# Patient Record
Sex: Female | Born: 1975 | Race: Black or African American | Hispanic: No | Marital: Single | Smoking: Current every day smoker
Health system: Southern US, Community
[De-identification: ages and names within clinical notes are randomized; demographics above are authoritative.]

## PROBLEM LIST (undated history)

## (undated) ENCOUNTER — Emergency Department (HOSPITAL_COMMUNITY): Admission: EM | Payer: Medicaid Other | Source: Home / Self Care

## (undated) DIAGNOSIS — D259 Leiomyoma of uterus, unspecified: Secondary | ICD-10-CM

## (undated) DIAGNOSIS — R102 Pelvic and perineal pain: Secondary | ICD-10-CM

## (undated) DIAGNOSIS — R51 Headache: Secondary | ICD-10-CM

## (undated) DIAGNOSIS — F17200 Nicotine dependence, unspecified, uncomplicated: Secondary | ICD-10-CM

## (undated) DIAGNOSIS — Z5189 Encounter for other specified aftercare: Secondary | ICD-10-CM

## (undated) DIAGNOSIS — G8929 Other chronic pain: Secondary | ICD-10-CM

## (undated) DIAGNOSIS — D649 Anemia, unspecified: Secondary | ICD-10-CM

## (undated) DIAGNOSIS — R002 Palpitations: Secondary | ICD-10-CM

## (undated) DIAGNOSIS — I10 Essential (primary) hypertension: Secondary | ICD-10-CM

## (undated) HISTORY — PX: CYSTOSCOPY: SUR368

## (undated) HISTORY — DX: Palpitations: R00.2

## (undated) HISTORY — PX: GANGLION CYST EXCISION: SHX1691

---

## 1998-11-18 DIAGNOSIS — Z5189 Encounter for other specified aftercare: Secondary | ICD-10-CM

## 1998-11-18 DIAGNOSIS — IMO0001 Reserved for inherently not codable concepts without codable children: Secondary | ICD-10-CM

## 1998-11-18 HISTORY — DX: Reserved for inherently not codable concepts without codable children: IMO0001

## 1998-11-18 HISTORY — DX: Encounter for other specified aftercare: Z51.89

## 2009-05-13 ENCOUNTER — Emergency Department (HOSPITAL_COMMUNITY): Admission: EM | Admit: 2009-05-13 | Discharge: 2009-05-14 | Payer: Self-pay | Admitting: Emergency Medicine

## 2010-10-26 ENCOUNTER — Ambulatory Visit (HOSPITAL_COMMUNITY)
Admission: RE | Admit: 2010-10-26 | Discharge: 2010-10-26 | Payer: Self-pay | Source: Home / Self Care | Attending: Obstetrics and Gynecology | Admitting: Obstetrics and Gynecology

## 2010-10-26 HISTORY — PX: HYSTEROSCOPY WITH D & C: SHX1775

## 2011-01-29 LAB — CBC
HCT: 35.9 % — ABNORMAL LOW (ref 36.0–46.0)
RDW: 15.2 % (ref 11.5–15.5)
WBC: 9.6 10*3/uL (ref 4.0–10.5)

## 2011-02-25 LAB — POCT I-STAT, CHEM 8
BUN: 6 mg/dL (ref 6–23)
Chloride: 104 mEq/L (ref 96–112)
HCT: 35 % — ABNORMAL LOW (ref 36.0–46.0)
Sodium: 140 mEq/L (ref 135–145)
TCO2: 22 mmol/L (ref 0–100)

## 2011-02-25 LAB — WET PREP, GENITAL: WBC, Wet Prep HPF POC: NONE SEEN

## 2011-02-25 LAB — URINALYSIS, ROUTINE W REFLEX MICROSCOPIC
Glucose, UA: NEGATIVE mg/dL
Leukocytes, UA: NEGATIVE
Protein, ur: NEGATIVE mg/dL
Urobilinogen, UA: 1 mg/dL (ref 0.0–1.0)

## 2011-02-25 LAB — DIFFERENTIAL
Basophils Absolute: 0 10*3/uL (ref 0.0–0.1)
Lymphocytes Relative: 32 % (ref 12–46)
Neutro Abs: 6.2 10*3/uL (ref 1.7–7.7)
Neutrophils Relative %: 62 % (ref 43–77)

## 2011-02-25 LAB — POCT PREGNANCY, URINE: Preg Test, Ur: NEGATIVE

## 2011-02-25 LAB — CBC
Platelets: 456 10*3/uL — ABNORMAL HIGH (ref 150–400)
RDW: 16.6 % — ABNORMAL HIGH (ref 11.5–15.5)

## 2011-02-25 LAB — RPR: RPR Ser Ql: NONREACTIVE

## 2011-02-25 LAB — GC/CHLAMYDIA PROBE AMP, GENITAL
Chlamydia, DNA Probe: NEGATIVE
GC Probe Amp, Genital: NEGATIVE

## 2011-02-25 LAB — URINE MICROSCOPIC-ADD ON

## 2011-08-16 ENCOUNTER — Emergency Department (INDEPENDENT_AMBULATORY_CARE_PROVIDER_SITE_OTHER): Payer: BC Managed Care – PPO

## 2011-08-16 ENCOUNTER — Encounter: Payer: Self-pay | Admitting: *Deleted

## 2011-08-16 DIAGNOSIS — R0989 Other specified symptoms and signs involving the circulatory and respiratory systems: Secondary | ICD-10-CM

## 2011-08-16 DIAGNOSIS — J40 Bronchitis, not specified as acute or chronic: Secondary | ICD-10-CM | POA: Insufficient documentation

## 2011-08-16 DIAGNOSIS — R062 Wheezing: Secondary | ICD-10-CM

## 2011-08-16 DIAGNOSIS — R059 Cough, unspecified: Secondary | ICD-10-CM

## 2011-08-16 DIAGNOSIS — R0602 Shortness of breath: Secondary | ICD-10-CM | POA: Insufficient documentation

## 2011-08-16 DIAGNOSIS — R05 Cough: Secondary | ICD-10-CM

## 2011-08-16 NOTE — ED Notes (Signed)
C/o chest congestion and cough x 2 weeks

## 2011-08-17 ENCOUNTER — Emergency Department (HOSPITAL_BASED_OUTPATIENT_CLINIC_OR_DEPARTMENT_OTHER)
Admission: EM | Admit: 2011-08-17 | Discharge: 2011-08-17 | Disposition: A | Discharge status: Home / Self Care | Payer: BC Managed Care – PPO | Attending: Emergency Medicine | Admitting: Emergency Medicine

## 2011-08-17 DIAGNOSIS — J40 Bronchitis, not specified as acute or chronic: Secondary | ICD-10-CM

## 2011-08-17 MED ORDER — AZITHROMYCIN 250 MG PO TABS: 250.0000 mg | ORAL_TABLET | Freq: Every day | ORAL | Status: AC

## 2011-08-17 MED ORDER — AZITHROMYCIN 250 MG PO TABS
500.0000 mg | ORAL_TABLET | Freq: Once | ORAL | Status: AC
  Administered 2011-08-17: 500 mg via ORAL
  Filled 2011-08-17: qty 2

## 2011-08-17 NOTE — ED Notes (Signed)
Wants to know how long this will take,wants to be eating by 2am

## 2011-08-17 NOTE — ED Provider Notes (Signed)
History     CSN: [id number] Arrival date & time: 08/17/2011 12:35 AM  Chief Complaint  Patient presents with  . Cough    (Consider location/radiation/quality/duration/timing/severity/associated sxs/prior treatment) HPI Comments: No better for one week.  Patient is a 35 y.o. female presenting with cough. The history is provided by the patient.  Cough This is a new problem. The current episode started more than 1 week ago. The problem occurs constantly. The problem has been gradually worsening. The cough is non-productive. There has been no fever. Associated symptoms include shortness of breath and wheezing. Pertinent negatives include no chest pain and no chills. She has tried nothing for the symptoms. She is a smoker. Her past medical history does not include bronchitis or pneumonia.    History reviewed. No pertinent past medical history.  History reviewed. No pertinent past surgical history.  History reviewed. No pertinent family history.  History  Substance Use Topics  . Smoking status: Current Everyday Smoker  . Smokeless tobacco: Not on file  . Alcohol Use: No    OB History    Grav Para Term Preterm Abortions TAB SAB Ect Mult Living                  Review of Systems  Constitutional: Negative for chills.  Respiratory: Positive for cough, shortness of breath and wheezing.   Cardiovascular: Negative for chest pain.  All other systems reviewed and are negative.    Allergies  Review of patient's allergies indicates no known allergies.  Home Medications  No current outpatient prescriptions on file.  BP 132/78  Pulse 78  Temp(Src) 98.3 F (36.8 C) (Oral)  Resp 20  Ht 5\' 7"  (1.702 m)  Wt 223 lb (101.152 kg)  BMI 34.93 kg/m2  SpO2 100%  LMP 08/15/2011  Physical Exam  Constitutional: She is oriented to person, place, and time. She appears well-developed and well-nourished. No distress.  HENT:  Head: Normocephalic and atraumatic.  Right Ear: External ear  normal.  Left Ear: External ear normal.  Mouth/Throat: Oropharynx is clear and moist.  Neck: Normal range of motion. Neck supple.  Cardiovascular: Normal rate and regular rhythm.  Exam reveals no gallop and no friction rub.   No murmur heard. Pulmonary/Chest: Effort normal and breath sounds normal. No respiratory distress. She has no wheezes. She has no rales.  Abdominal: Soft.  Musculoskeletal: Normal range of motion.  Lymphadenopathy:    She has no cervical adenopathy.  Neurological: She is alert and oriented to person, place, and time.  Skin: Skin is warm and dry.    ED Course  Procedures (including critical care time)  Labs Reviewed - No data to display Dg Chest 2 View  08/17/2011  *RADIOLOGY REPORT*  Clinical Data: Cough, congestion, wheezing  CHEST - 2 VIEW  Comparison: None.  Findings: Lungs are clear. No pleural effusion or pneumothorax.  Cardiomediastinal silhouette is within normal limits.  Visualized osseous structures are within normal limits.  IMPRESSION: No evidence of acute cardiopulmonary disease.  Original Report Authenticated By: Charline Bills, M.D.     No diagnosis found.    MDM  No better in 2 weeks.  Will treat with antibiotics.        Geoffery Lyons, MD 08/17/11 [phone]

## 2011-09-04 ENCOUNTER — Encounter (HOSPITAL_BASED_OUTPATIENT_CLINIC_OR_DEPARTMENT_OTHER): Payer: Self-pay | Admitting: *Deleted

## 2011-09-04 ENCOUNTER — Emergency Department (INDEPENDENT_AMBULATORY_CARE_PROVIDER_SITE_OTHER): Payer: BC Managed Care – PPO

## 2011-09-04 ENCOUNTER — Emergency Department (HOSPITAL_BASED_OUTPATIENT_CLINIC_OR_DEPARTMENT_OTHER)
Admission: EM | Admit: 2011-09-04 | Discharge: 2011-09-04 | Disposition: A | Discharge status: Home / Self Care | Payer: BC Managed Care – PPO | Attending: Emergency Medicine | Admitting: Emergency Medicine

## 2011-09-04 DIAGNOSIS — N854 Malposition of uterus: Secondary | ICD-10-CM

## 2011-09-04 DIAGNOSIS — K5900 Constipation, unspecified: Secondary | ICD-10-CM | POA: Insufficient documentation

## 2011-09-04 DIAGNOSIS — N83209 Unspecified ovarian cyst, unspecified side: Secondary | ICD-10-CM

## 2011-09-04 DIAGNOSIS — R109 Unspecified abdominal pain: Secondary | ICD-10-CM | POA: Insufficient documentation

## 2011-09-04 DIAGNOSIS — F17200 Nicotine dependence, unspecified, uncomplicated: Secondary | ICD-10-CM | POA: Insufficient documentation

## 2011-09-04 DIAGNOSIS — D649 Anemia, unspecified: Secondary | ICD-10-CM | POA: Insufficient documentation

## 2011-09-04 HISTORY — DX: Leiomyoma of uterus, unspecified: D25.9

## 2011-09-04 LAB — CBC
HCT: 30.9 % — ABNORMAL LOW (ref 36.0–46.0)
Hemoglobin: 9.9 g/dL — ABNORMAL LOW (ref 12.0–15.0)
RDW: 14.9 % (ref 11.5–15.5)
WBC: 10.9 10*3/uL — ABNORMAL HIGH (ref 4.0–10.5)

## 2011-09-04 LAB — URINALYSIS, ROUTINE W REFLEX MICROSCOPIC
Leukocytes, UA: NEGATIVE
Specific Gravity, Urine: 1.028 (ref 1.005–1.030)
Urobilinogen, UA: 0.2 mg/dL (ref 0.0–1.0)

## 2011-09-04 LAB — URINE MICROSCOPIC-ADD ON

## 2011-09-04 LAB — PREGNANCY, URINE: Preg Test, Ur: NEGATIVE

## 2011-09-04 MED ORDER — MORPHINE SULFATE 4 MG/ML IJ SOLN
4.0000 mg | Freq: Once | INTRAMUSCULAR | Status: AC
  Administered 2011-09-04: 4 mg via INTRAVENOUS

## 2011-09-04 MED ORDER — ONDANSETRON HCL 4 MG/2ML IJ SOLN
INTRAMUSCULAR | Status: AC
  Filled 2011-09-04: qty 2

## 2011-09-04 MED ORDER — OXYCODONE-ACETAMINOPHEN 5-325 MG PO TABS: 2.0000 | ORAL_TABLET | ORAL | Status: AC | PRN

## 2011-09-04 MED ORDER — MORPHINE SULFATE 4 MG/ML IJ SOLN
INTRAMUSCULAR | Status: AC
  Filled 2011-09-04: qty 1

## 2011-09-04 MED ORDER — ONDANSETRON HCL 4 MG/2ML IJ SOLN
4.0000 mg | Freq: Once | INTRAMUSCULAR | Status: AC
  Administered 2011-09-04: 4 mg via INTRAVENOUS

## 2011-09-04 NOTE — ED Provider Notes (Signed)
History     CSN: [id number] Arrival date & time: 09/04/2011  5:41 PM   First MD Initiated Contact with Patient 09/04/11 1735      Chief Complaint  Patient presents with  . Constipation  . Abdominal Pain    (Consider location/radiation/quality/duration/timing/severity/associated sxs/prior treatment) HPI Comments: Pt states that she hasn't had a bm in 3 days:pt states that she also has a history of fibroids:pt states that today she is not sure which is causing her pain, but she states that she can't sit down because sitting makes the symptoms worse  Patient is a 35 y.o. female presenting with constipation. The history is provided by the patient. No language interpreter was used.  Constipation  The current episode started 3 to 5 days ago. The problem occurs continuously. The problem has been gradually worsening. The pain is severe. There was no prior successful therapy.    Past Medical History  Diagnosis Date  . Fibroid, uterus     History reviewed. No pertinent past surgical history.  History reviewed. No pertinent family history.  History  Substance Use Topics  . Smoking status: Current Everyday Smoker -- 0.5 packs/day  . Smokeless tobacco: Not on file  . Alcohol Use: No    OB History    Grav Para Term Preterm Abortions TAB SAB Ect Mult Living                  Review of Systems  Gastrointestinal: Positive for constipation.  All other systems reviewed and are negative.    Allergies  Review of patient's allergies indicates no known allergies.  Home Medications  No current outpatient prescriptions on file.  BP 126/77  Pulse 82  Temp(Src) 98.2 F (36.8 C) (Oral)  Resp 18  Ht 5' 7.5" (1.715 m)  Wt 221 lb 1 oz (100.273 kg)  BMI 34.11 kg/m2  SpO2 100%  LMP 08/15/2011  Physical Exam  Nursing note and vitals reviewed. Constitutional: She is oriented to person, place, and time. She appears well-developed and well-nourished.  HENT:  Head: Normocephalic and  atraumatic.  Eyes: Pupils are equal, round, and reactive to light.  Neck: Normal range of motion. Neck supple.  Cardiovascular: Normal rate and regular rhythm.   Pulmonary/Chest: Effort normal.  Abdominal: Soft. Bowel sounds are normal.  Genitourinary:       No impaction noted  Neurological: She is alert and oriented to person, place, and time.  Skin: Skin is warm and dry.  Psychiatric: She has a normal mood and affect.    ED Course  Procedures (including critical care time)  Labs Reviewed  CBC - Abnormal; Notable for the following:    WBC 10.9 (*)    Hemoglobin 9.9 (*)    HCT 30.9 (*)    MCV 77.4 (*)    MCH 24.8 (*)    All other components within normal limits  URINALYSIS, ROUTINE W REFLEX MICROSCOPIC - Abnormal; Notable for the following:    Hgb urine dipstick TRACE (*)    All other components within normal limits  URINE MICROSCOPIC-ADD ON - Abnormal; Notable for the following:    Squamous Epithelial / LPF FEW (*)    Bacteria, UA FEW (*)    All other components within normal limits  PREGNANCY, URINE   US Transvaginal Non-ob  09/04/2011  *RADIOLOGY REPORT*  Clinical Data: Abdominal pain.  History fibroids.  TRANSABDOMINAL AND TRANSVAGINAL ULTRASOUND OF PELVIS  Technique:  Both transabdominal and transvaginal ultrasound examinations of the pelvis were performed including  evaluation of the uterus, ovaries, adnexal regions, and pelvic cul-de-sac.  Comparison: None.  Findings:  Uterus: 7.0 x 4.9 x 4.8 cm.  Uterus is retroverted.  No focal abnormality.  Endometrium: Normal appearance and thickness, 11 mm.  Right Ovary:  The right ovary measures 4.6 x 3.6 x 3.4 cm.  There is a complex cystic area within the right ovary measuring 3.6 x 3.1 x 2.9 cm.  Internal septations noted.  There is a large amount of free fluid adjacent to the right ovary which appears partially loculated in the right ovary.  Left Ovary: 2.4 x 1.6 x 1.7 cm. Normal size and echotexture.  No adnexal masses.  Other  Findings:  No free fluid.  IMPRESSION: 3.6 cm complex cystic lesion within the right ovary.  There is also a large amount of adjacent free fluid adjacent to the right ovary. Question ruptured cyst.  A complex cyst warrants close follow-up with repeat ultrasound in 6-8 weeks.  While this could reflect a hemorrhagic cyst, cystic ovarian neoplasm cannot be excluded.  Original Report Authenticated By: Cyndie Chime, M.D.   US Pelvis Complete  09/04/2011  *RADIOLOGY REPORT*  Clinical Data: Abdominal pain.  History fibroids.  TRANSABDOMINAL AND TRANSVAGINAL ULTRASOUND OF PELVIS  Technique:  Both transabdominal and transvaginal ultrasound examinations of the pelvis were performed including evaluation of the uterus, ovaries, adnexal regions, and pelvic cul-de-sac.  Comparison: None.  Findings:  Uterus: 7.0 x 4.9 x 4.8 cm.  Uterus is retroverted.  No focal abnormality.  Endometrium: Normal appearance and thickness, 11 mm.  Right Ovary:  The right ovary measures 4.6 x 3.6 x 3.4 cm.  There is a complex cystic area within the right ovary measuring 3.6 x 3.1 x 2.9 cm.  Internal septations noted.  There is a large amount of free fluid adjacent to the right ovary which appears partially loculated in the right ovary.  Left Ovary: 2.4 x 1.6 x 1.7 cm. Normal size and echotexture.  No adnexal masses.  Other Findings:  No free fluid.  IMPRESSION: 3.6 cm complex cystic lesion within the right ovary.  There is also a large amount of adjacent free fluid adjacent to the right ovary. Question ruptured cyst.  A complex cyst warrants close follow-up with repeat ultrasound in 6-8 weeks.  While this could reflect a hemorrhagic cyst, cystic ovarian neoplasm cannot be excluded.  Original Report Authenticated By: Cyndie Chime, M.D.     1. Ovarian cyst   2. Anemia   3. Constipation       MDM  Pt has a history of anemia and pt states that it has been worse then this:pt states that she is not symptomatic at this time:discussed  right cyst with pt and discussed and pt states that she has a history of fibroids, but not of cyst:pt is to follow [id number] use of miralax with the pt        Teressa Lower, NP 09/04/11 2005

## 2011-09-04 NOTE — ED Notes (Signed)
Pt is standing in room and states she more comfortable standing

## 2011-09-04 NOTE — ED Notes (Signed)
While giving pt instructions for percocet. Pt stated that 5mg  of oxycodone doesn't work and requested stronger oxycodone. Informed pt prescription was written for 2 percocets at a time. PA made aware of pt request and no new prescription was written.

## 2011-09-04 NOTE — ED Notes (Signed)
Pt c/o no BM x 3 days, lower abd pain with hx of fibroids

## 2011-09-04 NOTE — ED Notes (Signed)
Pt ambulatory to ultrasound.

## 2011-09-06 NOTE — ED Provider Notes (Signed)
Evaluation and management procedures were performed by the PA/NP under my supervision/collaboration.    Edem Tiegs D Johnie Makki, MD 09/06/11 1929 

## 2011-09-30 ENCOUNTER — Encounter (HOSPITAL_BASED_OUTPATIENT_CLINIC_OR_DEPARTMENT_OTHER): Payer: Self-pay | Admitting: *Deleted

## 2011-09-30 NOTE — H&P (Signed)
Felipa Eth  DICTATION # [id number] CSN# [id number]   Meriel Pica, MD 09/30/2011 9:32 AM

## 2011-09-30 NOTE — Progress Notes (Signed)
NPO after MN. Pt to arrive at 0615. Needs hg and urine preg.

## 2011-09-30 NOTE — H&P (Signed)
NAMELUCAS, EXLINE NO.:  [id number]  MEDICAL RECORD NO.:  [id number]  LOCATION:                               FACILITY:  [facility]  PHYSICIAN:  Duke Salvia. Marcelle Overlie, M.D.DATE OF BIRTH:  September 28, 1976  DATE OF ADMISSION:  10/01/2011 DATE OF DISCHARGE:                             HISTORY & PHYSICAL   DATE OF SCHEDULED SURGERY:  October 01, 2011.  CHIEF COMPLAINT:  Chronic pelvic pain.  HISTORY OF PRESENT ILLNESS:  This is a 35 year old, G0, P0, was seen recently after evaluation at the [facility] ED for acute and chronic abdominal pelvic pain.  They performed an ultrasound in [facility] that showed no evidence of fibroids, but she did have a right-sided 3.6 x 3.1 x 2.9 cm complex ovarian mass with some free fluid consistent with a ruptured ovarian cyst.  She continued to have problems with pelvic pain that she states have been more of an issue to her over the last 6 months including deep dyspareunia and now is requiring narcotics for relief. We discussed several options.  She prefers diagnostic laparoscopy, which she presents for now.  This procedure including risks related to bleeding, infection, adjacent organ injury, the possible need for open additional surgery, all discussed with her, which she understands and accepts.  PAST MEDICAL HISTORY:  Allergies, none.  CURRENT MEDICATIONS:  Oxycodone p.r.n.  PRIOR SURGERY:  She had D and C, hysteroscopy for an endometrial polyp that was October 26, 2010, and also removal of a ganglion cyst in her right wrist.  REVIEW OF SYSTEMS:  Significant for anemia.  FAMILY HISTORY:  Otherwise negative.  SOCIAL HISTORY:  Smokes 1 PPD.  Denies alcohol or drug use.  She is single.  PHYSICAL EXAMINATION:  VITAL SIGNS:  Temperature 98.2, blood pressure 130/82. HEENT:  Unremarkable. NECK:  Supple without masses. LUNGS:  Clear. CARDIOVASCULAR:  Regular rate and rhythm without murmurs, rubs, gallops. BREASTS:  Without  masses. ABDOMEN:  Soft, flat, nontender. PELVIC:  Normal external genitalia.  Vagina and cervix clear.  Uterus mid position, normal size.  Adnexa, negative. EXTREMITIES:  Unremarkable. NEUROLOGIC:  Unremarkable.  IMPRESSION:  Chronic pelvic pain.  PLAN:  Diagnostic laparoscopy.  Procedure and risks reviewed as above.     Jazzalynn Rhudy M. Marcelle Overlie, M.D.     RM[facility]/MEDQ  D:  09/30/2011  T:  09/30/2011  Job:  [id number]

## 2011-10-01 ENCOUNTER — Ambulatory Visit (HOSPITAL_BASED_OUTPATIENT_CLINIC_OR_DEPARTMENT_OTHER)
Admission: RE | Admit: 2011-10-01 | Discharge: 2011-10-01 | Disposition: A | Discharge status: Home / Self Care | Payer: BC Managed Care – PPO | Source: Ambulatory Visit | Attending: Obstetrics and Gynecology | Admitting: Obstetrics and Gynecology

## 2011-10-01 ENCOUNTER — Encounter (HOSPITAL_BASED_OUTPATIENT_CLINIC_OR_DEPARTMENT_OTHER)
Admission: RE | Disposition: A | Discharge status: Home / Self Care | Payer: Self-pay | Source: Ambulatory Visit | Attending: Obstetrics and Gynecology

## 2011-10-01 ENCOUNTER — Encounter (HOSPITAL_BASED_OUTPATIENT_CLINIC_OR_DEPARTMENT_OTHER): Payer: Self-pay | Admitting: Anesthesiology

## 2011-10-01 ENCOUNTER — Ambulatory Visit (HOSPITAL_BASED_OUTPATIENT_CLINIC_OR_DEPARTMENT_OTHER): Payer: BC Managed Care – PPO | Admitting: Anesthesiology

## 2011-10-01 ENCOUNTER — Encounter (HOSPITAL_COMMUNITY): Payer: Self-pay | Admitting: *Deleted

## 2011-10-01 ENCOUNTER — Inpatient Hospital Stay (HOSPITAL_COMMUNITY)
Admission: AD | Admit: 2011-10-01 | Discharge: 2011-10-02 | Disposition: A | Discharge status: Home / Self Care | Payer: BC Managed Care – PPO | Source: Ambulatory Visit | Attending: Obstetrics and Gynecology | Admitting: Obstetrics and Gynecology

## 2011-10-01 DIAGNOSIS — R109 Unspecified abdominal pain: Secondary | ICD-10-CM

## 2011-10-01 DIAGNOSIS — N736 Female pelvic peritoneal adhesions (postinfective): Secondary | ICD-10-CM | POA: Insufficient documentation

## 2011-10-01 DIAGNOSIS — N949 Unspecified condition associated with female genital organs and menstrual cycle: Secondary | ICD-10-CM | POA: Insufficient documentation

## 2011-10-01 DIAGNOSIS — N731 Chronic parametritis and pelvic cellulitis: Secondary | ICD-10-CM | POA: Insufficient documentation

## 2011-10-01 HISTORY — PX: LAPAROSCOPY: SHX197

## 2011-10-01 HISTORY — DX: Pelvic and perineal pain: R10.2

## 2011-10-01 HISTORY — DX: Other chronic pain: G89.29

## 2011-10-01 SURGERY — Surgical Case
Anesthesia: *Unknown

## 2011-10-01 SURGERY — LAPAROSCOPY, DIAGNOSTIC
Anesthesia: General | Site: Abdomen | Wound class: Clean Contaminated

## 2011-10-01 MED ORDER — ONDANSETRON HCL 4 MG/2ML IJ SOLN
INTRAMUSCULAR | Status: DC | PRN
  Administered 2011-10-01: 4 mg via INTRAVENOUS

## 2011-10-01 MED ORDER — MIDAZOLAM HCL 5 MG/5ML IJ SOLN
INTRAMUSCULAR | Status: DC | PRN
  Administered 2011-10-01: 2 mg via INTRAVENOUS
  Administered 2011-10-01 (×2): 1 mg via INTRAVENOUS

## 2011-10-01 MED ORDER — CEFOTETAN DISODIUM 2 G IJ SOLR: 2.0000 g | INTRAMUSCULAR | Status: DC

## 2011-10-01 MED ORDER — OXYCODONE-ACETAMINOPHEN 5-325 MG PO TABS
1.0000 | ORAL_TABLET | ORAL | Status: DC | PRN
  Administered 2011-10-01: 1 via ORAL

## 2011-10-01 MED ORDER — NEOSTIGMINE METHYLSULFATE 1 MG/ML IJ SOLN
INTRAMUSCULAR | Status: DC | PRN
  Administered 2011-10-01: 5 mg via INTRAVENOUS

## 2011-10-01 MED ORDER — LACTATED RINGERS IV SOLN
INTRAVENOUS | Status: DC
  Administered 2011-10-01: 07:00:00 via INTRAVENOUS

## 2011-10-01 MED ORDER — DEXTROSE 5 % IV SOLN
1.0000 g | INTRAVENOUS | Status: DC | PRN
  Administered 2011-10-01: 2 g via INTRAVENOUS

## 2011-10-01 MED ORDER — DEXAMETHASONE SODIUM PHOSPHATE 4 MG/ML IJ SOLN
INTRAMUSCULAR | Status: DC | PRN
  Administered 2011-10-01: 4 mg via INTRAVENOUS

## 2011-10-01 MED ORDER — GLYCOPYRROLATE 0.2 MG/ML IJ SOLN
INTRAMUSCULAR | Status: DC | PRN
  Administered 2011-10-01: .6 mg via INTRAVENOUS

## 2011-10-01 MED ORDER — KETOROLAC TROMETHAMINE 60 MG/2ML IM SOLN
INTRAMUSCULAR | Status: DC | PRN
  Administered 2011-10-01: 30 mg via INTRAMUSCULAR

## 2011-10-01 MED ORDER — LACTATED RINGERS IR SOLN: Status: DC | PRN

## 2011-10-01 MED ORDER — PROPOFOL 10 MG/ML IV EMUL
INTRAVENOUS | Status: DC | PRN
  Administered 2011-10-01: 300 mg via INTRAVENOUS

## 2011-10-01 MED ORDER — LACTATED RINGERS IV SOLN
INTRAVENOUS | Status: DC | PRN
  Administered 2011-10-01 (×2): via INTRAVENOUS

## 2011-10-01 MED ORDER — BUPIVACAINE HCL (PF) 0.25 % IJ SOLN
INTRAMUSCULAR | Status: DC | PRN
  Administered 2011-10-01: 7 mL

## 2011-10-01 MED ORDER — ROCURONIUM BROMIDE 100 MG/10ML IV SOLN
INTRAVENOUS | Status: DC | PRN
  Administered 2011-10-01: 50 mg via INTRAVENOUS

## 2011-10-01 MED ORDER — LIDOCAINE HCL (CARDIAC) 10 MG/ML IV SOLN
INTRAVENOUS | Status: DC | PRN
  Administered 2011-10-01: 80 mg via INTRAVENOUS

## 2011-10-01 MED ORDER — FENTANYL CITRATE 0.05 MG/ML IJ SOLN
INTRAMUSCULAR | Status: DC | PRN
  Administered 2011-10-01: 100 ug via INTRAVENOUS
  Administered 2011-10-01 (×2): 50 ug via INTRAVENOUS

## 2011-10-01 SURGICAL SUPPLY — 60 items
APPLICATOR COTTON TIP 6IN STRL (MISCELLANEOUS) IMPLANT
BAG URINE DRAINAGE (UROLOGICAL SUPPLIES) IMPLANT
BANDAGE ADHESIVE 1X3 (GAUZE/BANDAGES/DRESSINGS) IMPLANT
BLADE SURG 11 STRL SS (BLADE) ×2 IMPLANT
BLADE SURG ROTATE 9660 (MISCELLANEOUS) IMPLANT
CANISTER SUCTION 2500CC (MISCELLANEOUS) IMPLANT
CATH FOLEY 2WAY SLVR  5CC 16FR (CATHETERS)
CATH FOLEY 2WAY SLVR 5CC 16FR (CATHETERS) IMPLANT
CATH ROBINSON RED A/P 16FR (CATHETERS) ×2 IMPLANT
CLOTH BEACON ORANGE TIMEOUT ST (SAFETY) ×2 IMPLANT
DERMABOND ADVANCED (GAUZE/BANDAGES/DRESSINGS) ×1
DERMABOND ADVANCED .7 DNX12 (GAUZE/BANDAGES/DRESSINGS) ×1 IMPLANT
DRAPE CAMERA CLOSED 9X96 (DRAPES) ×2 IMPLANT
DRAPE UNDERBUTTOCKS STRL (DRAPE) ×2 IMPLANT
DRESSING TELFA 8X3 (GAUZE/BANDAGES/DRESSINGS) IMPLANT
ELECT REM PT RETURN 9FT ADLT (ELECTROSURGICAL) ×2
ELECTRODE REM PT RTRN 9FT ADLT (ELECTROSURGICAL) ×1 IMPLANT
FILTER SMOKE EVAC LAPAROSHD (FILTER) IMPLANT
FORCEPS BIOP CUTTING (INSTRUMENTS) IMPLANT
GLOVE BIO SURGEON STRL SZ7 (GLOVE) ×4 IMPLANT
GLOVE ECLIPSE 6.0 STRL STRAW (GLOVE) ×2 IMPLANT
GLOVE INDICATOR 6.5 STRL GRN (GLOVE) ×2 IMPLANT
GOWN PREVENTION PLUS LG XLONG (DISPOSABLE) ×4 IMPLANT
NEEDLE HYPO 25X1 1.5 SAFETY (NEEDLE) IMPLANT
NEEDLE INSUFFLATION 14GA 120MM (NEEDLE) ×2 IMPLANT
NEEDLE INSUFFLATION 14GA 150MM (NEEDLE) IMPLANT
NS IRRIG 500ML POUR BTL (IV SOLUTION) ×2 IMPLANT
PACK BASIN DAY SURGERY FS (CUSTOM PROCEDURE TRAY) ×2 IMPLANT
PACK LAPAROSCOPY II (CUSTOM PROCEDURE TRAY) ×2 IMPLANT
PAD OB MATERNITY 4.3X12.25 (PERSONAL CARE ITEMS) ×2 IMPLANT
PAD PREP 24X48 CUFFED NSTRL (MISCELLANEOUS) ×2 IMPLANT
PENCIL BUTTON HOLSTER BLD 10FT (ELECTRODE) IMPLANT
POUCH SPECIMEN RETRIEVAL 10MM (ENDOMECHANICALS) IMPLANT
SCALPEL HARMONIC ACE (MISCELLANEOUS) IMPLANT
SCISSORS LAP 5X35 DISP (ENDOMECHANICALS) IMPLANT
SEALER TISSUE G2 CVD JAW 35 (ENDOMECHANICALS) IMPLANT
SEALER TISSUE G2 CVD JAW 45CM (ENDOMECHANICALS)
SET IRRIG TUBING LAPAROSCOPIC (IRRIGATION / IRRIGATOR) IMPLANT
SLEEVE SURGEON STRL (DRAPES) ×2 IMPLANT
SOLUTION ANTI FOG 6CC (MISCELLANEOUS) ×2 IMPLANT
SPONGE GAUZE 4X4 12PLY (GAUZE/BANDAGES/DRESSINGS) ×2 IMPLANT
STRIP CLOSURE SKIN 1/4X4 (GAUZE/BANDAGES/DRESSINGS) IMPLANT
SUT MNCRL AB 3-0 PS2 18 (SUTURE) ×2 IMPLANT
SUT MON AB 2-0 CT1 36 (SUTURE) IMPLANT
SUT PLAIN 4 0 FS 2 27 (SUTURE) IMPLANT
SUT VICRYL 0 TIES 12 18 (SUTURE) IMPLANT
SUT VICRYL 0 UR6 27IN ABS (SUTURE) IMPLANT
SUT VICRYL RAPIDE 4/0 PS 2 (SUTURE) ×2 IMPLANT
SYR 3ML 23GX1 SAFETY (SYRINGE) IMPLANT
SYR CONTROL 10ML LL (SYRINGE) ×2 IMPLANT
SYRINGE 10CC LL (SYRINGE) IMPLANT
TAPE PAPER MEDFIX 1IN X 10YD (GAUZE/BANDAGES/DRESSINGS) ×2 IMPLANT
TOWEL OR 17X24 6PK STRL BLUE (TOWEL DISPOSABLE) ×4 IMPLANT
TRAY DSU PREP LF (CUSTOM PROCEDURE TRAY) ×2 IMPLANT
TROCAR XCEL BLUNT TIP 100MML (ENDOMECHANICALS) IMPLANT
TROCAR Z-THREAD BLADED 11X100M (TROCAR) ×2 IMPLANT
TROCAR Z-THREAD BLADED 5X100MM (TROCAR) ×2 IMPLANT
TUBING INSUFFLATION W/FILTER (TUBING) ×2 IMPLANT
VACUUM HOSE/TUBING 7/8INX6FT (MISCELLANEOUS) IMPLANT
WATER STERILE IRR 500ML POUR (IV SOLUTION) ×2 IMPLANT

## 2011-10-01 NOTE — Progress Notes (Signed)
Received report as caregiver. 

## 2011-10-01 NOTE — Anesthesia Preprocedure Evaluation (Signed)
Anesthesia Evaluation  Patient identified by MRN, date of birth, ID band Patient awake    Reviewed: Allergy & Precautions, H&P , NPO status , Patient's Chart, lab work & pertinent test results  Airway Mallampati: III TM Distance: >3 FB     Dental  (+) Teeth Intact and Dental Advisory Given   Pulmonary Current Smoker,  clear to auscultation  Pulmonary exam normal       Cardiovascular neg cardio ROS Regular Normal+ Systolic murmurs    Neuro/Psych Negative Neurological ROS     GI/Hepatic negative GI ROS, Neg liver ROS,   Endo/Other  Morbid obesity  Renal/GU negative Renal ROS     Musculoskeletal negative musculoskeletal ROS (+)   Abdominal Normal abdominal exam  (+)   Peds negative pediatric ROS (+)  Hematology negative hematology ROS (+)   Anesthesia Other Findings   Reproductive/Obstetrics                           Anesthesia Physical Anesthesia Plan  ASA: III  Anesthesia Plan: General   Post-op Pain Management:    Induction: Intravenous  Airway Management Planned: Oral ETT  Additional Equipment:   Intra-op Plan:   Post-operative Plan:   Informed Consent: I have reviewed the patients History and Physical, chart, labs and discussed the procedure including the risks, benefits and alternatives for the proposed anesthesia with the patient or authorized representative who has indicated his/her understanding and acceptance.   Dental advisory given  Plan Discussed with: CRNA  Anesthesia Plan Comments:         Anesthesia Quick Evaluation

## 2011-10-01 NOTE — Op Note (Signed)
Preoperative diagnosis: Acute and chronic pelvic pain  Postoperative diagnosis: Chronic PID, pelvic adhesive disease, same.  Pacita: Diagnostic laparoscopy  Surgeon: Marcelle Overlie  EBL: Minimal  Complications: None  Procedure and findings:  The patient taken the operating room after an adequate level of general anesthesia was obtained with the patient's legs cervix the abdomen perineum and vagina were prepped and draped in the usual manner for laparoscopy. The bladder was drained, he UA carried out uterus is midposition normal size non-mobile adnexa negative. A Conn cannula was positioned. Attention directed to the abdomen where a 2 cm subumbilical incision was made after infiltrating with quarter percent Marcaine plain the varies needle was introduced without difficulty its intra-abdominal position was verified by pressure water testing. After a 3 L pneumoperitoneum syncopated lap scopic trocar and sleeve were then introduced without difficulty. There was no evidence of any bleeding or trauma. 3 finger restaurant symphysis in the midline a 5 mm trocar was inserted under direct visualization and the patient then placed in Trendelenburg. The pelvic findings as follows  The uterus itself was normal size there were several small serosal fibroids but there was bilateral hydrosalpinx and significant adnexal adhesive disease on either side along with some minimal endometriosis. I could never fully visualize the left adnexa and the right ovary but not elevated a severe adhesive disease. After these findings for further documented in sutures removed gas allowed escape because closed with 4 Dexon subcuticular at the umbilicus and Dermabond in the lower incision. She tolerated this well went to recovery in good condition.  Dictated with [facility], not proofread  Randie Bloodgood M. Milana Obey.D.

## 2011-10-01 NOTE — Progress Notes (Signed)
Pt states she is having pain on both sides of her abdomen.

## 2011-10-01 NOTE — Transfer of Care (Signed)
Immediate Anesthesia Transfer of Care Note  Patient: Stacey Weeks  Procedure(s) Performed:  LAPAROSCOPY DIAGNOSTIC  Patient Location: PACU  Anesthesia Type: General  Level of Consciousness: awake, alert , oriented, patient cooperative and responds to stimulation  Airway & Oxygen Therapy: Patient Spontanous Breathing  Post-op Assessment: Report given to PACU RN  Post vital signs: stable  Complications: No apparent anesthesia complications

## 2011-10-01 NOTE — Anesthesia Postprocedure Evaluation (Signed)
Anesthesia Post Note  Patient: Stacey Weeks  Procedure(s) Performed:  LAPAROSCOPY DIAGNOSTIC  Anesthesia type: General  Patient location: PACU  Post pain: Pain level controlled  Post assessment: Post-op Vital signs reviewed  Last Vitals:  Filed Vitals:   10/01/11 0900  BP: 120/77  Pulse: 64  Temp:   Resp: 14    Post vital signs: Reviewed  Level of consciousness: sedated  Complications: No apparent anesthesia complications

## 2011-10-01 NOTE — Progress Notes (Signed)
Pt states she had a laparoscopy today for ruptured ovarian cyst that has been causing pt pain.

## 2011-10-01 NOTE — ED Provider Notes (Signed)
History     No chief complaint on file.  HPI Stacey Weeks 35 y.o. had laparoscopic surgery today.  Did not get prescription for pain medication and only has 2 tablets left.  Is worried as she is having abominal pain.  Took pain medication last at 9:30 pm.     OB History    Grav Para Term Preterm Abortions TAB SAB Ect Mult Living   0               Past Medical History  Diagnosis Date  . Fibroid, uterus   . Chronic pelvic pain in female     Past Surgical History  Procedure Date  . Hysteroscopy w/d&c 10-26-10    polypectomy  . Ganglion cyst excision age 21    right wrist    History reviewed. No pertinent family history.  History  Substance Use Topics  . Smoking status: Current Everyday Smoker -- 1.0 packs/day for 6 years    Types: Cigarettes  . Smokeless tobacco: Never Used  . Alcohol Use: No    Allergies: No Known Allergies  Prescriptions prior to admission  Medication Sig Dispense Refill  . oxyCODONE-acetaminophen (PERCOCET) 5-325 MG per tablet Take 1 tablet by mouth every 4 (four) hours as needed.          Review of Systems  Gastrointestinal: Positive for abdominal pain.   Physical Exam   Blood pressure 125/66, pulse 85, temperature 98.6 F (37 C), temperature source Oral, resp. rate 18, height 5\' 6"  (1.676 m), weight 227 lb 8 oz (103.193 kg), last menstrual period 09/15/2011.  Physical Exam  Nursing note and vitals reviewed. Constitutional: She is oriented to person, place, and time. She appears well-developed and well-nourished.  HENT:  Head: Normocephalic.  Eyes: EOM are normal.  Neck: Neck supple.  GI: Soft. Bowel sounds are normal. She exhibits distension. There is tenderness. There is no rebound and no guarding.  Musculoskeletal: Normal range of motion.  Neurological: She is alert and oriented to person, place, and time.  Skin: Skin is warm and dry.  Psychiatric: She has a normal mood and affect.    MAU Course   Procedures  MDM Discussed plan with Dr. Renaldo Fiddler  Assessment and Plan  Abdominal pain S/P laparoscopic surgery today  Plan: Will prescribe percocet for her Follow up in the office in 7 days Call the office if you have questions or concerns BURLESON,TERRI 10/01/2011, 11:58 PM   Nolene Bernheim, NP 10/02/11 0009

## 2011-10-01 NOTE — Anesthesia Procedure Notes (Addendum)
Procedure Name: Intubation Date/Time: 10/01/2011 7:44 AM Performed by: Iline Oven Pre-anesthesia Checklist: Patient identified, Emergency Drugs available and Suction available Patient Re-evaluated:Patient Re-evaluated prior to inductionOxygen Delivery Method: Circle System Utilized Preoxygenation: Pre-oxygenation with 100% oxygen (Pt smoked 3 cigarettes this am- Pre O2 well ) Intubation Type: IV induction Ventilation: Mask ventilation without difficulty Grade View: Grade III Tube type: Oral Tube size: 8.0 mm Number of attempts: 2 (Pillow removed for better visualization due to large braided hair bun ) Airway Equipment and Method: stylet and oral airway Placement Confirmation: ETT inserted through vocal cords under direct vision,  positive ETCO2 and breath sounds checked- equal and bilateral Secured at: 23 cm Tube secured with: Tape Dental Injury: Teeth and Oropharynx as per pre-operative assessment

## 2011-10-01 NOTE — Progress Notes (Signed)
Dr. Marcelle Overlie paged per pt/ mom request.  Informed of their wish to speak w/ him re procedure and his findings.  Dr. Marcelle Overlie unable to speak w/ them at this time, update given by dr. Marcelle Overlie which was relayed to pt/ mom.  Pt / mom informed they would receive detailed explanations w/ plan of care at the f/u visit.

## 2011-10-01 NOTE — Progress Notes (Signed)
No change in patientt examination

## 2011-10-01 NOTE — Progress Notes (Signed)
No change in H+PE. 

## 2011-10-01 NOTE — Progress Notes (Signed)
PT  SAYS  IN DEC HAD POLYPS - HAD SURGERY- JAN.  THEN IN FEB- HAD FIBROIDS.     THEN 10-18- HAD CYST ON OVARY  TO RUPT.  THEN   TODAY AT 0730 HAD LAP-   INCISION DOES NOT HURT-  BOTH SIDES OF  ABD HURT. TOOK   OXYCODONE- LAST AT  930PM.- NO RELIEF.   PT ONLY HAS 2 PILLS LEFT FROM OLD RX- DID NOT UNDERSTAND D/C INSTR  WITH RX.        FOLLOW-UP IN 7 D\AYS.

## 2011-10-02 MED ORDER — OXYCODONE-ACETAMINOPHEN 5-325 MG PO TABS: 2.0000 | ORAL_TABLET | ORAL | Status: AC | PRN

## 2011-10-07 ENCOUNTER — Encounter (HOSPITAL_BASED_OUTPATIENT_CLINIC_OR_DEPARTMENT_OTHER): Payer: Self-pay | Admitting: Obstetrics and Gynecology

## 2011-11-27 ENCOUNTER — Encounter (HOSPITAL_COMMUNITY): Payer: Self-pay | Admitting: Pharmacist

## 2011-11-27 NOTE — H&P (Signed)
  [name]  DICTATION # [phone] CSN# [id number]   Meriel Pica, MD 11/27/2011 3:11 PM

## 2011-11-28 NOTE — H&P (Signed)
NAME:  ,                                 ACCOUNT NO.:  MEDICAL RECORD NO.:  LOCATION:                                 FACILITY:  PHYSICIAN:  Constantine Ruddick M. Marcelle Overlie, M.D.    DATE OF BIRTH:  DATE OF ADMISSION: DATE OF DISCHARGE:                             HISTORY & PHYSICAL   Date of scheduled surgery is December 11, 2011.  CHIEF COMPLAINT:  Acute on chronic pelvic pain, chronic PID by recent laparoscopy.  HISTORY:  [age]-year-old G0, P0.  This patient has 6-12 months of history of acute on chronic pelvic pain.  Before I saw her in November 2012, she has been evaluated in outside ED where ultrasound showed complex cyst on the right, was 3.6 x 3.1 x 2.9.  Due to continued problems with pelvic pain, she opted for diagnostic laparoscopy which was carried on October 01, 2011.  Findings at the time of laparoscopy demonstrated several serosal small fibroids with some minimal endometriosis and severe adhesive disease on both adnexa, with hydrosalpinx on both sides.  These findings were explained to her postoperatively and due to the severity of the pain, she has opted to pursue TAH-BSO.  She has received full bowel prep with GoLYTELY, erythromycin, and neomycin.  This procedure including risks related to bleeding, infection, transfusion, adjacent organ injury.  __________ expected recovery time, and the need for ERT.  PAST MEDICAL HISTORY:  Allergies none.  CURRENT MEDICATIONS:  Oxycodone p.r.n. pain.  PREVIOUS SURGERY:  Diagnostic arthroscopy in November 2012.  The patient also had a prior D and C hysteroscopy in September 2011 and removal of ganglion cyst in the right wrist.  Review of systems significant for history of anemia.  FAMILY HISTORY:  Otherwise negative.  SOCIAL HISTORY:  Smokes 1 PPD.  Denies alcohol or drug use.  PHYSICAL EXAMINATION:  VITAL SIGNS:  Temperature 98.3, blood pressure 128/72. HEENT:  Unremarkable. NECK:  Supple without masses. LUNGS:   Clear. CARDIOVASCULAR:  Regular rate and rhythm without murmurs, rubs, or gallops noted. BREASTS:  Without masses. ABDOMEN:  Soft, flat, nontender. PELVIC:  Normal external genitalia. Vagina and cervix are clear. Uterus is midposition, normal size.  Adnexa without masses or nodularity or tenderness. NEUROLOGIC:  Unremarkable.  IMPRESSION:  Severe pelvic inflammatory disease/pelvic endometriosis on recent laparoscopy.  PLAN:  __________ as above.     Kourtnie Sachs M. Marcelle Overlie, M.D.     [facility]  D:  11/27/2011  T:  11/28/2011  Job:  [id number]

## 2011-12-03 ENCOUNTER — Other Ambulatory Visit (HOSPITAL_COMMUNITY): Payer: BC Managed Care – PPO

## 2011-12-10 ENCOUNTER — Encounter (HOSPITAL_COMMUNITY): Payer: Self-pay

## 2011-12-10 ENCOUNTER — Encounter (HOSPITAL_COMMUNITY)
Admission: RE | Admit: 2011-12-10 | Discharge: 2011-12-10 | Disposition: A | Discharge status: Home / Self Care | Payer: BC Managed Care – PPO | Source: Ambulatory Visit | Attending: Obstetrics and Gynecology | Admitting: Obstetrics and Gynecology

## 2011-12-10 HISTORY — DX: Anemia, unspecified: D64.9

## 2011-12-10 HISTORY — DX: Nicotine dependence, unspecified, uncomplicated: F17.200

## 2011-12-10 HISTORY — DX: Encounter for other specified aftercare: Z51.89

## 2011-12-10 LAB — CBC
HCT: 31.7 % — ABNORMAL LOW (ref 36.0–46.0)
MCV: 77.7 fL — ABNORMAL LOW (ref 78.0–100.0)
Platelets: 357 10*3/uL (ref 150–400)
RBC: 4.08 MIL/uL (ref 3.87–5.11)
RDW: 14.6 % (ref 11.5–15.5)
WBC: 8.2 10*3/uL (ref 4.0–10.5)

## 2011-12-10 LAB — COMPREHENSIVE METABOLIC PANEL
AST: 14 U/L (ref 0–37)
Albumin: 3.5 g/dL (ref 3.5–5.2)
Alkaline Phosphatase: 64 U/L (ref 39–117)
BUN: 10 mg/dL (ref 6–23)
CO2: 28 mEq/L (ref 19–32)
Chloride: 104 mEq/L (ref 96–112)
Creatinine, Ser: 0.72 mg/dL (ref 0.50–1.10)
GFR calc non Af Amer: >90 mL/min (ref >90)
Potassium: 3.8 mEq/L (ref 3.5–5.1)
Total Bilirubin: 0.2 mg/dL — ABNORMAL LOW (ref 0.3–1.2)

## 2011-12-10 LAB — SURGICAL PCR SCREEN: Staphylococcus aureus: NEGATIVE

## 2011-12-10 NOTE — Patient Instructions (Addendum)
   Your procedure is scheduled on: Wednesday January 23rd  Enter through the Main Entrance of [facility] at:6am Pick up the phone at the desk and dial [phone] and inform us of your arrival.  Please call this number if you have any problems the morning of surgery: [phone]  Remember: Do not eat food after midnight:Tuesday Do not drink clear liquids after:midnight Tuesday Take these medicines the morning of surgery with a SIP OF WATER:none  Do not wear jewelry, make-up, or FINGER nail polish Do not wear lotions, powders, perfumes or deodorant. Do not shave 48 hours prior to surgery. Do not bring valuables to the hospital.  Leave suitcase in the car. After Surgery it may be brought to your room. For patients being admitted to the hospital, checkout time is 11:00am the day of discharge.     Remember to use your hibiclens as instructed.Please shower with 1/2 bottle the evening before your surgery and the other 1/2 bottle the morning of surgery.

## 2011-12-11 ENCOUNTER — Inpatient Hospital Stay (HOSPITAL_COMMUNITY)
Admission: RE | Admit: 2011-12-11 | Discharge: 2011-12-13 | DRG: 359 | Disposition: A | Discharge status: Home / Self Care | Payer: BC Managed Care – PPO | Source: Ambulatory Visit | Attending: Obstetrics and Gynecology | Admitting: Obstetrics and Gynecology

## 2011-12-11 ENCOUNTER — Inpatient Hospital Stay (HOSPITAL_COMMUNITY): Payer: BC Managed Care – PPO | Admitting: Anesthesiology

## 2011-12-11 ENCOUNTER — Other Ambulatory Visit: Payer: Self-pay | Admitting: Obstetrics and Gynecology

## 2011-12-11 ENCOUNTER — Encounter (HOSPITAL_COMMUNITY): Payer: Self-pay | Admitting: *Deleted

## 2011-12-11 ENCOUNTER — Encounter (HOSPITAL_COMMUNITY)
Admission: RE | Disposition: A | Discharge status: Home / Self Care | Payer: Self-pay | Source: Ambulatory Visit | Attending: Obstetrics and Gynecology

## 2011-12-11 ENCOUNTER — Encounter (HOSPITAL_COMMUNITY): Payer: Self-pay | Admitting: Anesthesiology

## 2011-12-11 DIAGNOSIS — D252 Subserosal leiomyoma of uterus: Secondary | ICD-10-CM | POA: Diagnosis present

## 2011-12-11 DIAGNOSIS — D251 Intramural leiomyoma of uterus: Secondary | ICD-10-CM | POA: Diagnosis present

## 2011-12-11 DIAGNOSIS — N803 Endometriosis of pelvic peritoneum, unspecified: Principal | ICD-10-CM | POA: Diagnosis present

## 2011-12-11 DIAGNOSIS — Z01812 Encounter for preprocedural laboratory examination: Secondary | ICD-10-CM

## 2011-12-11 DIAGNOSIS — Z01818 Encounter for other preprocedural examination: Secondary | ICD-10-CM

## 2011-12-11 DIAGNOSIS — N739 Female pelvic inflammatory disease, unspecified: Secondary | ICD-10-CM | POA: Diagnosis present

## 2011-12-11 DIAGNOSIS — N8000 Endometriosis of the uterus, unspecified: Secondary | ICD-10-CM | POA: Diagnosis present

## 2011-12-11 DIAGNOSIS — N949 Unspecified condition associated with female genital organs and menstrual cycle: Secondary | ICD-10-CM | POA: Diagnosis present

## 2011-12-11 HISTORY — PX: SALPINGOOPHORECTOMY: SHX82

## 2011-12-11 HISTORY — PX: ABDOMINAL HYSTERECTOMY: SHX81

## 2011-12-11 LAB — PREGNANCY, URINE: Preg Test, Ur: NEGATIVE

## 2011-12-11 SURGERY — HYSTERECTOMY, ABDOMINAL
Anesthesia: General | Site: Abdomen | Wound class: Clean Contaminated

## 2011-12-11 MED ORDER — ONDANSETRON HCL 4 MG/2ML IJ SOLN
INTRAMUSCULAR | Status: AC
  Filled 2011-12-11: qty 2

## 2011-12-11 MED ORDER — KETOROLAC TROMETHAMINE 30 MG/ML IJ SOLN: 30.0000 mg | Freq: Once | INTRAMUSCULAR | Status: DC

## 2011-12-11 MED ORDER — DIPHENHYDRAMINE HCL 12.5 MG/5ML PO ELIX
12.5000 mg | ORAL_SOLUTION | Freq: Four times a day (QID) | ORAL | Status: DC | PRN
  Administered 2011-12-12: 12.5 mg via ORAL
  Filled 2011-12-11: qty 5

## 2011-12-11 MED ORDER — LIDOCAINE HCL (CARDIAC) 20 MG/ML IV SOLN
INTRAVENOUS | Status: AC
Start: 2011-12-11 — End: 2011-12-11
  Filled 2011-12-11: qty 5

## 2011-12-11 MED ORDER — PROPOFOL 10 MG/ML IV EMUL
INTRAVENOUS | Status: DC | PRN
  Administered 2011-12-11: 180 mg via INTRAVENOUS

## 2011-12-11 MED ORDER — HYDROMORPHONE HCL PF 1 MG/ML IJ SOLN
INTRAMUSCULAR | Status: AC
  Filled 2011-12-11: qty 1

## 2011-12-11 MED ORDER — GLYCOPYRROLATE 0.2 MG/ML IJ SOLN
INTRAMUSCULAR | Status: AC
  Filled 2011-12-11: qty 1

## 2011-12-11 MED ORDER — DIPHENHYDRAMINE HCL 50 MG/ML IJ SOLN: 12.5000 mg | Freq: Four times a day (QID) | INTRAMUSCULAR | Status: DC | PRN

## 2011-12-11 MED ORDER — FENTANYL CITRATE 0.05 MG/ML IJ SOLN
INTRAMUSCULAR | Status: AC
  Filled 2011-12-11: qty 2

## 2011-12-11 MED ORDER — IBUPROFEN 800 MG PO TABS
800.0000 mg | ORAL_TABLET | Freq: Three times a day (TID) | ORAL | Status: DC | PRN
  Administered 2011-12-12: 800 mg via ORAL
  Filled 2011-12-11: qty 1

## 2011-12-11 MED ORDER — KETOROLAC TROMETHAMINE 30 MG/ML IJ SOLN: 15.0000 mg | Freq: Once | INTRAMUSCULAR | Status: DC | PRN

## 2011-12-11 MED ORDER — FENTANYL CITRATE 0.05 MG/ML IJ SOLN
INTRAMUSCULAR | Status: DC | PRN
  Administered 2011-12-11 (×2): 50 ug via INTRAVENOUS
  Administered 2011-12-11: 100 ug via INTRAVENOUS
  Administered 2011-12-11: 50 ug via INTRAVENOUS
  Administered 2011-12-11: 100 ug via INTRAVENOUS

## 2011-12-11 MED ORDER — MIDAZOLAM HCL 2 MG/2ML IJ SOLN
INTRAMUSCULAR | Status: AC
  Filled 2011-12-11: qty 2

## 2011-12-11 MED ORDER — BUPIVACAINE HCL (PF) 0.5 % IJ SOLN
INTRAMUSCULAR | Status: DC | PRN
  Administered 2011-12-11: 270 mL

## 2011-12-11 MED ORDER — BUTORPHANOL TARTRATE 2 MG/ML IJ SOLN: 1.0000 mg | INTRAMUSCULAR | Status: DC | PRN

## 2011-12-11 MED ORDER — HYDROMORPHONE HCL PF 1 MG/ML IJ SOLN
INTRAMUSCULAR | Status: AC
  Administered 2011-12-11: 0.5 mg via INTRAVENOUS
  Filled 2011-12-11: qty 1

## 2011-12-11 MED ORDER — NEOSTIGMINE METHYLSULFATE 1 MG/ML IJ SOLN
INTRAMUSCULAR | Status: DC | PRN
  Administered 2011-12-11: 3 mg via INTRAVENOUS

## 2011-12-11 MED ORDER — NEOSTIGMINE METHYLSULFATE 1 MG/ML IJ SOLN
INTRAMUSCULAR | Status: AC
  Filled 2011-12-11: qty 10

## 2011-12-11 MED ORDER — DEXTROSE IN LACTATED RINGERS 5 % IV SOLN
INTRAVENOUS | Status: DC
  Administered 2011-12-11 – 2011-12-12 (×2): via INTRAVENOUS

## 2011-12-11 MED ORDER — HYDROMORPHONE HCL PF 1 MG/ML IJ SOLN
0.2500 mg | INTRAMUSCULAR | Status: DC | PRN
  Administered 2011-12-11: 0.5 mg via INTRAVENOUS

## 2011-12-11 MED ORDER — MENTHOL 3 MG MT LOZG: 1.0000 | LOZENGE | OROMUCOSAL | Status: DC | PRN

## 2011-12-11 MED ORDER — DEXAMETHASONE SODIUM PHOSPHATE 10 MG/ML IJ SOLN
INTRAMUSCULAR | Status: AC
  Filled 2011-12-11: qty 1

## 2011-12-11 MED ORDER — ONDANSETRON HCL 4 MG/2ML IJ SOLN
INTRAMUSCULAR | Status: DC | PRN
  Administered 2011-12-11: 4 mg via INTRAVENOUS

## 2011-12-11 MED ORDER — FENTANYL CITRATE 0.05 MG/ML IJ SOLN
INTRAMUSCULAR | Status: AC
  Filled 2011-12-11: qty 5

## 2011-12-11 MED ORDER — ROCURONIUM BROMIDE 50 MG/5ML IV SOLN
INTRAVENOUS | Status: AC
  Filled 2011-12-11: qty 1

## 2011-12-11 MED ORDER — LACTATED RINGERS IV SOLN
INTRAVENOUS | Status: DC
  Administered 2011-12-11 (×4): via INTRAVENOUS

## 2011-12-11 MED ORDER — CEFAZOLIN SODIUM 1-5 GM-% IV SOLN: 1.0000 g | INTRAVENOUS | Status: DC

## 2011-12-11 MED ORDER — OXYCODONE-ACETAMINOPHEN 5-325 MG PO TABS
1.0000 | ORAL_TABLET | ORAL | Status: DC | PRN
  Administered 2011-12-12: 1 via ORAL
  Filled 2011-12-11 (×2): qty 2

## 2011-12-11 MED ORDER — LIDOCAINE HCL 1 % IJ SOLN
INTRAMUSCULAR | Status: DC | PRN
  Administered 2011-12-11: 10 mL

## 2011-12-11 MED ORDER — GLYCOPYRROLATE 0.2 MG/ML IJ SOLN
INTRAMUSCULAR | Status: DC | PRN
  Administered 2011-12-11: .2 mg via INTRAVENOUS
  Administered 2011-12-11: .6 mg via INTRAVENOUS

## 2011-12-11 MED ORDER — CEFAZOLIN SODIUM 1-5 GM-% IV SOLN
INTRAVENOUS | Status: AC
  Administered 2011-12-11: 1 g via INTRAVENOUS
  Filled 2011-12-11: qty 50

## 2011-12-11 MED ORDER — KETOROLAC TROMETHAMINE 60 MG/2ML IM SOLN
INTRAMUSCULAR | Status: AC
  Filled 2011-12-11: qty 2

## 2011-12-11 MED ORDER — ONDANSETRON HCL 4 MG/2ML IJ SOLN: 4.0000 mg | Freq: Four times a day (QID) | INTRAMUSCULAR | Status: DC | PRN

## 2011-12-11 MED ORDER — MIDAZOLAM HCL 5 MG/5ML IJ SOLN
INTRAMUSCULAR | Status: DC | PRN
  Administered 2011-12-11: 2 mg via INTRAVENOUS

## 2011-12-11 MED ORDER — SODIUM CHLORIDE 0.9 % IJ SOLN: 9.0000 mL | INTRAMUSCULAR | Status: DC | PRN

## 2011-12-11 MED ORDER — LACTATED RINGERS IV SOLN: INTRAVENOUS | Status: DC

## 2011-12-11 MED ORDER — ZOLPIDEM TARTRATE 5 MG PO TABS: 5.0000 mg | ORAL_TABLET | Freq: Every evening | ORAL | Status: DC | PRN

## 2011-12-11 MED ORDER — KETOROLAC TROMETHAMINE 30 MG/ML IJ SOLN: 30.0000 mg | Freq: Four times a day (QID) | INTRAMUSCULAR | Status: DC

## 2011-12-11 MED ORDER — KETOROLAC TROMETHAMINE 30 MG/ML IJ SOLN
30.0000 mg | Freq: Four times a day (QID) | INTRAMUSCULAR | Status: DC
  Administered 2011-12-11 – 2011-12-12 (×3): 30 mg via INTRAVENOUS
  Filled 2011-12-11 (×3): qty 1

## 2011-12-11 MED ORDER — DEXAMETHASONE SODIUM PHOSPHATE 4 MG/ML IJ SOLN
INTRAMUSCULAR | Status: DC | PRN
  Administered 2011-12-11: 10 mg via INTRAVENOUS

## 2011-12-11 MED ORDER — MORPHINE SULFATE (PF) 1 MG/ML IV SOLN
INTRAVENOUS | Status: DC
  Administered 2011-12-11: 12:00:00 via INTRAVENOUS
  Administered 2011-12-11: 8 mL via INTRAVENOUS
  Administered 2011-12-11: 25 mg via INTRAVENOUS
  Administered 2011-12-11: 15 mL via INTRAVENOUS
  Administered 2011-12-11: 4 mL via INTRAVENOUS
  Administered 2011-12-12: 7 mg via INTRAVENOUS
  Administered 2011-12-12: 3 mg via INTRAVENOUS
  Filled 2011-12-11 (×2): qty 25

## 2011-12-11 MED ORDER — ONDANSETRON HCL 4 MG PO TABS: 4.0000 mg | ORAL_TABLET | Freq: Four times a day (QID) | ORAL | Status: DC | PRN

## 2011-12-11 MED ORDER — LIDOCAINE HCL (CARDIAC) 20 MG/ML IV SOLN
INTRAVENOUS | Status: DC | PRN
  Administered 2011-12-11: 20 mg via INTRAVENOUS
  Administered 2011-12-11: 80 mg via INTRAVENOUS

## 2011-12-11 MED ORDER — KETOROLAC TROMETHAMINE 30 MG/ML IJ SOLN
INTRAMUSCULAR | Status: DC | PRN
  Administered 2011-12-11: 60 mg via INTRAVENOUS

## 2011-12-11 MED ORDER — NALOXONE HCL 0.4 MG/ML IJ SOLN: 0.4000 mg | INTRAMUSCULAR | Status: DC | PRN

## 2011-12-11 MED ORDER — ROCURONIUM BROMIDE 100 MG/10ML IV SOLN
INTRAVENOUS | Status: DC | PRN
  Administered 2011-12-11: 5 mg via INTRAVENOUS
  Administered 2011-12-11: 45 mg via INTRAVENOUS
  Administered 2011-12-11: 10 mg via INTRAVENOUS

## 2011-12-11 MED ORDER — HYDROMORPHONE HCL PF 1 MG/ML IJ SOLN
INTRAMUSCULAR | Status: DC | PRN
  Administered 2011-12-11 (×2): 1 mg via INTRAVENOUS

## 2011-12-11 MED ORDER — PROPOFOL 10 MG/ML IV EMUL
INTRAVENOUS | Status: AC
  Filled 2011-12-11: qty 20

## 2011-12-11 SURGICAL SUPPLY — 39 items
BARRIER ADHS 3X4 INTERCEED (GAUZE/BANDAGES/DRESSINGS) IMPLANT
CANISTER SUCTION 2500CC (MISCELLANEOUS) ×3 IMPLANT
CATH KIT ON Q 5IN DUAL SLV (PAIN MANAGEMENT) ×3 IMPLANT
CLOTH BEACON ORANGE TIMEOUT ST (SAFETY) ×3 IMPLANT
CONT PATH 16OZ SNAP LID 3702 (MISCELLANEOUS) ×3 IMPLANT
DECANTER SPIKE VIAL GLASS SM (MISCELLANEOUS) IMPLANT
DRSG TEGADERM 2.38X2.75 (GAUZE/BANDAGES/DRESSINGS) ×6 IMPLANT
ELECT LIGASURE SHORT 9 REUSE (ELECTRODE) ×3 IMPLANT
GLOVE BIO SURGEON STRL SZ7 (GLOVE) ×6 IMPLANT
GOWN PREVENTION PLUS LG XLONG (DISPOSABLE) ×9 IMPLANT
NEEDLE HYPO 25X1 1.5 SAFETY (NEEDLE) ×3 IMPLANT
NS IRRIG 1000ML POUR BTL (IV SOLUTION) ×3 IMPLANT
PACK ABDOMINAL GYN (CUSTOM PROCEDURE TRAY) ×3 IMPLANT
PAD ABD 7.5X8 STRL (GAUZE/BANDAGES/DRESSINGS) ×3 IMPLANT
PAD ABD DERMACEA PRESS 5X9 (GAUZE/BANDAGES/DRESSINGS) ×3 IMPLANT
PAD OB MATERNITY 4.3X12.25 (PERSONAL CARE ITEMS) ×3 IMPLANT
SPONGE GAUZE 4X4 12PLY (GAUZE/BANDAGES/DRESSINGS) ×3 IMPLANT
SPONGE LAP 18X18 X RAY DECT (DISPOSABLE) ×6 IMPLANT
STRIP CLOSURE SKIN 1/4X4 (GAUZE/BANDAGES/DRESSINGS) ×3 IMPLANT
SUT CHROMIC 3 0 SH 27 (SUTURE) IMPLANT
SUT MON AB 2-0 CT1 36 (SUTURE) ×3 IMPLANT
SUT MON AB 4-0 PS1 27 (SUTURE) ×3 IMPLANT
SUT PDS AB 0 CT1 27 (SUTURE) ×6 IMPLANT
SUT PDS AB 1 CT  36 (SUTURE)
SUT PDS AB 1 CT 36 (SUTURE) IMPLANT
SUT VIC AB 0 CT1 18XCR BRD8 (SUTURE) ×4 IMPLANT
SUT VIC AB 0 CT1 27 (SUTURE) ×3
SUT VIC AB 0 CT1 27XBRD ANBCTR (SUTURE) ×6 IMPLANT
SUT VIC AB 0 CT1 8-18 (SUTURE) ×2
SUT VIC AB 2-0 CT1 27 (SUTURE)
SUT VIC AB 2-0 CT1 TAPERPNT 27 (SUTURE) IMPLANT
SUT VIC AB 3-0 CT1 27 (SUTURE) ×2
SUT VIC AB 3-0 CT1 TAPERPNT 27 (SUTURE) ×4 IMPLANT
SUT VICRYL 0 TIES 12 18 (SUTURE) ×3 IMPLANT
SYR CONTROL 10ML LL (SYRINGE) ×3 IMPLANT
TAPE CLOTH SURG 4X10 WHT LF (GAUZE/BANDAGES/DRESSINGS) ×3 IMPLANT
TOWEL OR 17X24 6PK STRL BLUE (TOWEL DISPOSABLE) ×6 IMPLANT
TRAY FOLEY CATH 14FR (SET/KITS/TRAYS/PACK) ×3 IMPLANT
WATER STERILE IRR 1000ML POUR (IV SOLUTION) ×3 IMPLANT

## 2011-12-11 NOTE — Progress Notes (Signed)
UR chart review completed.  

## 2011-12-11 NOTE — Brief Op Note (Signed)
12/11/2011  8:51 AM  PATIENT:  Stacey Weeks  36 y.o. female  PRE-OPERATIVE DIAGNOSIS:  Pelvic Inflamatory Disease,  endometriosis  POST-OPERATIVE DIAGNOSIS:  Pelvic Inflamatory Disease,  endometriosis  PROCEDURE:  Procedure(s): HYSTERECTOMY ABDOMINAL SALPINGO OOPHERECTOMY  SURGEON:  Surgeon(s): Meriel Pica, MD Juluis Mire, MD  PHYSICIAN ASSISTANT:   ASSISTANTS: [place]   ANESTHESIA:   general  EBL:  Total I/O In: 1000 [I.V.:1000] Out: 400 [Urine:200; Blood:200]  BLOOD ADMINISTERED:none  DRAINS: Urinary Catheter (Foley)   LOCAL MEDICATIONS USED:  NONE  SPECIMEN:  Source of Specimen:  uterus, BSO  DISPOSITION OF SPECIMEN:  PATHOLOGY  COUNTS:  YES  TOURNIQUET:  * No tourniquets in log *  DICTATION: .Dragon Dictation  PLAN OF CARE: Admit to inpatient   PATIENT DISPOSITION:  PACU - hemodynamically stable.   Delay start of Pharmacological VTE agent (>24hrs) due to surgical blood loss or risk of bleeding:  {YES/NO/NOT APPLICABLE:20182  Preoperative diagnosis: Acute and chronic pelvic pain, pelvic endometriosis, chronic PID  Postoperative diagnosis: Same  Procedure: TAH BSO, lysis of adhesion, placement of On-Q catheters x2  Surgeon: Marcelle Overlie  Assistant:,  EBL: 200 cc  Specimens removed: Uterus, bilateral tubes and ovaries to pathology  Complications: None  Procedure and findings:  Patient was taken to the operating room after an adequate level of general anesthesia was obtained with the patient in supine position the abdomen perineum and vagina were prepped and draped separately, Foley catheter positioned draining clear urine. Transverse incision was made 2 finger breaths above the symphysis carried down to the fascia which was incised and extended transversely. Rectus muscles divided in the midline, peritoneum entered superiorly without incident and extended in a vertical fashion. The O'Connor-O'Sullivan retractor was in position the bowels packed  superiorly out of the field with the patient in Trendelenburg pelvic findings as follows:  As noted her laparoscopy there was a right hydrosalpinx significant adhesions of bilateral tubes to the pelvic sidewall and ovaries the cul-de-sac was partially obliterated in the fundus of the uterus was stuck into the cul-de-sac area. After these findings for noted, long Kelly clamps were then placed in each utero-ovarian pedicle, starting posteriorly a careful sharp and blunt dissection staying on the uterine side was used to dissect the: Freed from the posterior side of the uterus freeing it up down below the cervical vaginal junction. The : And serosa were intact. There was some evidence of fibrosis and old endometriosis in the cul-de-sac. Then, starting on the left round ligament was clamped divided and suture ligated the retroperitoneal space on that side was developed the left IP ligament was isolated the course of the ureter was noted be well below the left IP ligament was then clamped divided first free tie followed by suture ligature of Vicryl peritoneum was carried around anteriorly the bladder was dissected sharply about bluntly below the cervical vaginal junction the exact same repeated on the right side after dissecting the ovary free from the pelvic sidewall, the right IP ligament was isolated course of the ureter noted to be well below clamped divided first free tie followed by suture ligature of Vicryl. In sequential manner, staying close to the uterus and assuring that the bladder was well below and that the: Have been dissected well below the cervical vaginal junction posteriorly, the uterine vasculature pedicles, cardinal ligament uterosacral ligament and cervical vaginal pedicles were clamped divided and suture ligated with 0 Vicryl the remainder the uterus was then excised the cervix was noted be included in  the specimen the cuff was then closed with interrupted 2-0 Vicryl sutures. The pelvis was  irrigated with saline and aspirated and be hemostatic clear urine reported that point. Prior to closure sponge denies precast reported as correct x2 peritoneum was then closed with a running 2-0 Vicryl suture. Rectus muscles reapproximated in the midline with 2-0 Vicryl interrupted sutures. Fascia closure laterally to midline on either side with 0 PDS suture. Prior to complete closure the fashion On-Q catheter was positioned with a separate incision in the six-inch needle introducer the catheter positioned in the subfascial space a second one was introduced into the subcutaneous space. Remainder the fascia was closed with 0 PDS suture. Subcutaneous tissue was irrigated noted to be hemostatic a 4-0 Monocryl subcuticular suture was used to close the skin, the On-Q catheters were primed with 4 cc 1% Xylocaine at the site and a sterile dressing was applied she tolerated this well went to recovery in good condition.  Dictated with dragon medical Merina Behrendt M. Milana Obey.D.

## 2011-12-11 NOTE — Transfer of Care (Signed)
Immediate Anesthesia Transfer of Care Note  Patient: Stacey Weeks  Procedure(s) Performed:  HYSTERECTOMY ABDOMINAL; SALPINGO OOPHERECTOMY  Patient Location: PACU  Anesthesia Type: General  Level of Consciousness: awake, alert , oriented and patient cooperative  Airway & Oxygen Therapy: Patient Spontanous Breathing and Patient connected to nasal cannula oxygen  Post-op Assessment: Report given to PACU RN and Post -op Vital signs reviewed and stable  Post vital signs: Reviewed and stable  Complications: No apparent anesthesia complications

## 2011-12-11 NOTE — Anesthesia Preprocedure Evaluation (Signed)
Anesthesia Evaluation  Patient identified by MRN, date of birth, ID band Patient awake    Reviewed: Allergy & Precautions, H&P , NPO status , Patient's Chart, lab work & pertinent test results  Airway Mallampati: II TM Distance: >3 FB Neck ROM: full    Dental No notable dental hx. (+) Teeth Intact   Pulmonary neg pulmonary ROS,  clear to auscultation  Pulmonary exam normal       Cardiovascular neg cardio ROS     Neuro/Psych Negative Neurological ROS  Negative Psych ROS   GI/Hepatic negative GI ROS, Neg liver ROS,   Endo/Other  Morbid obesity  Renal/GU negative Renal ROS  Genitourinary negative   Musculoskeletal negative musculoskeletal ROS (+)   Abdominal (+) obese,   Peds negative pediatric ROS (+)  Hematology negative hematology ROS (+)   Anesthesia Other Findings   Reproductive/Obstetrics negative OB ROS                           Anesthesia Physical Anesthesia Plan  ASA: III  Anesthesia Plan: General   Post-op Pain Management:    Induction: Intravenous  Airway Management Planned: Oral ETT  Additional Equipment:   Intra-op Plan:   Post-operative Plan: Extubation in OR  Informed Consent: I have reviewed the patients History and Physical, chart, labs and discussed the procedure including the risks, benefits and alternatives for the proposed anesthesia with the patient or authorized representative who has indicated his/her understanding and acceptance.   Dental Advisory Given  Plan Discussed with: CRNA  Anesthesia Plan Comments:         Anesthesia Quick Evaluation

## 2011-12-11 NOTE — Progress Notes (Signed)
Pt's on-q pump dsg remains clean and intact.

## 2011-12-11 NOTE — Preoperative (Signed)
Beta Blockers   Reason not to administer Beta Blockers:Not Applicable 

## 2011-12-11 NOTE — Progress Notes (Signed)
The patient was re-examined with no change in status 

## 2011-12-11 NOTE — Progress Notes (Signed)
Pt has ON-Q pump.Stacey Weeks

## 2011-12-11 NOTE — Anesthesia Postprocedure Evaluation (Signed)
Anesthesia Post Note  Patient: Stacey Weeks  Procedure(s) Performed:  HYSTERECTOMY ABDOMINAL; SALPINGO OOPHERECTOMY  Anesthesia type: General  Patient location: PACU  Post pain: Pain level controlled  Post assessment: Post-op Vital signs reviewed  Last Vitals:  Filed Vitals:   12/11/11 1015  BP: 138/78  Pulse: 93  Temp: 37.3 C  Resp: 18    Post vital signs: Reviewed  Level of consciousness: sedated  Complications: No apparent anesthesia complications

## 2011-12-11 NOTE — Progress Notes (Signed)
Patient ID: Stacey Weeks, female   DOB: 1976-06-28, 36 y.o.   MRN: [id number] Resting comfortably, clear UOP, dressing dry.Marland KitchenMarland KitchenMarland Kitchen

## 2011-12-12 ENCOUNTER — Encounter (HOSPITAL_COMMUNITY): Payer: Self-pay | Admitting: Obstetrics and Gynecology

## 2011-12-12 LAB — CBC
MCV: 78 fL (ref 78.0–100.0)
Platelets: 359 10*3/uL (ref 150–400)
RDW: 14.6 % (ref 11.5–15.5)
WBC: 20.2 10*3/uL — ABNORMAL HIGH (ref 4.0–10.5)

## 2011-12-12 MED ORDER — HYDROCODONE-ACETAMINOPHEN 5-325 MG PO TABS
2.0000 | ORAL_TABLET | ORAL | Status: DC | PRN
  Administered 2011-12-13: 2 via ORAL
  Filled 2011-12-12: qty 2

## 2011-12-12 NOTE — Progress Notes (Signed)
1 Day Post-Op Procedure(s): HYSTERECTOMY ABDOMINAL SALPINGO OOPHERECTOMY  Subjective: Patient reports tolerating PO.    Objective: I have reviewed patient's vital signs, intake and output, medications and labs.  Inc C/D, + BS  Results for orders placed during the hospital encounter of 12/11/11 (from the past 24 hour(s))  CBC     Status: Abnormal   Collection Time   12/12/11  5:25 AM      Component Value Range   WBC 20.2 (*) 4.0 - 10.5 (K/uL)   RBC 3.23 (*) 3.87 - 5.11 (MIL/uL)   Hemoglobin 7.7 (*) 12.0 - 15.0 (g/dL)   HCT 86.5 (*) 78.4 - 46.0 (%)   MCV 78.0  78.0 - 100.0 (fL)   MCH 23.8 (*) 26.0 - 34.0 (pg)   MCHC 30.6  30.0 - 36.0 (g/dL)   RDW 69.6  29.5 - 28.4 (%)   Platelets 359  150 - 400 (K/uL)    Assessment: s/p Procedure(s): HYSTERECTOMY ABDOMINAL SALPINGO OOPHERECTOMY: stable  Plan: Up to walk, plan D/C in AM  LOS: 1 day    [facility] M 12/12/2011, 8:37 AM

## 2011-12-13 LAB — CBC
Hemoglobin: 7.5 g/dL — ABNORMAL LOW (ref 12.0–15.0)
MCHC: 31 g/dL (ref 30.0–36.0)
RDW: 15 % (ref 11.5–15.5)
WBC: 9 10*3/uL (ref 4.0–10.5)

## 2011-12-13 MED ORDER — HYDROCODONE-ACETAMINOPHEN 5-325 MG PO TABS: 2.0000 | ORAL_TABLET | Freq: Four times a day (QID) | ORAL | Status: AC | PRN

## 2011-12-13 MED ORDER — IBUPROFEN 800 MG PO TABS: 800.0000 mg | ORAL_TABLET | Freq: Three times a day (TID) | ORAL | Status: AC | PRN

## 2011-12-13 NOTE — Discharge Summary (Signed)
Physician Discharge Summary  Patient ID: Stacey Weeks MRN: [id number] DOB/AGE: [birth year] 35 y.o.  Admit date: 12/11/2011 Discharge date: 12/13/2011  Admission Diagnoses:  Discharge Diagnoses:  Active Problems:  * No active hospital problems. *    Discharged Condition: good  Hospital Course: TAHBSO, diet advanced POD 1, up ambulating,D/C on POD #2, afeb, Inc C/D  Consults: None  Significant Diagnostic Studies: Na Results for orders placed during the hospital encounter of 12/11/11 (from the past 48 hour(s))  CBC     Status: Abnormal   Collection Time   12/12/11  5:25 AM      Component Value Range Comment   WBC 20.2 (*) 4.0 - 10.5 (K/uL)    RBC 3.23 (*) 3.87 - 5.11 (MIL/uL)    Hemoglobin 7.7 (*) 12.0 - 15.0 (g/dL)    HCT 78.2 (*) 95.6 - 46.0 (%)    MCV 78.0  78.0 - 100.0 (fL)    MCH 23.8 (*) 26.0 - 34.0 (pg)    MCHC 30.6  30.0 - 36.0 (g/dL)    RDW 21.3  08.6 - 57.8 (%)    Platelets 359  150 - 400 (K/uL)   CBC     Status: Abnormal   Collection Time   12/13/11  5:15 AM      Component Value Range Comment   WBC 9.0  4.0 - 10.5 (K/uL)    RBC 3.10 (*) 3.87 - 5.11 (MIL/uL)    Hemoglobin 7.5 (*) 12.0 - 15.0 (g/dL)    HCT 46.9 (*) 62.9 - 46.0 (%)    MCV 78.1  78.0 - 100.0 (fL)    MCH 24.2 (*) 26.0 - 34.0 (pg)    MCHC 31.0  30.0 - 36.0 (g/dL)    RDW 52.8  41.3 - 24.4 (%)    Platelets 318  150 - 400 (K/uL)     Treatments: surgery: TAHBSO  Discharge Exam: Blood pressure 121/61, pulse 100, temperature 99.1 F (37.3 C), temperature source Oral, resp. rate 19, height 5\' 7"  (1.702 m), weight 223 lb (101.152 kg), SpO2 97.00%. Incision/Wound:C/D Disposition: Home or Self Care   Medication List  As of 12/13/2011  8:07 AM   STOP taking these medications         acetaminophen 500 MG tablet         TAKE these medications         HYDROcodone-acetaminophen 5-325 MG per tablet   Commonly known as: NORCO   Take 2 tablets by mouth every 6 (six) hours as needed.      ibuprofen  800 MG tablet   Commonly known as: ADVIL,MOTRIN   Take 1 tablet (800 mg total) by mouth every 8 (eight) hours as needed for pain (mild pain).      traMADol 50 MG tablet   Commonly known as: ULTRAM   Take 50 mg by mouth every 6 (six) hours as needed. For pain           Follow-up Information    Follow up with Meriel Pica, MD in 7 days. (office willcall)    Contact information:   [address] [place] [address] [phone]          Signed: Meriel Pica 12/13/2011, 8:07 AM

## 2011-12-13 NOTE — Progress Notes (Signed)
2 Days Post-Op Procedure(s): HYSTERECTOMY ABDOMINAL SALPINGO OOPHERECTOMY  Subjective: Patient reports tolerating PO.    Objective: I have reviewed patient's vital signs, medications and labs.  Inc C/D, ON Q cath out intact X 2, +BS Results for orders placed during the hospital encounter of 12/11/11 (from the past 24 hour(s))  CBC     Status: Abnormal   Collection Time   12/13/11  5:15 AM      Component Value Range   WBC 9.0  4.0 - 10.5 (K/uL)   RBC 3.10 (*) 3.87 - 5.11 (MIL/uL)   Hemoglobin 7.5 (*) 12.0 - 15.0 (g/dL)   HCT 78.2 (*) 95.6 - 46.0 (%)   MCV 78.1  78.0 - 100.0 (fL)   MCH 24.2 (*) 26.0 - 34.0 (pg)   MCHC 31.0  30.0 - 36.0 (g/dL)   RDW 21.3  08.6 - 57.8 (%)   Platelets 318  150 - 400 (K/uL)   Assessment: s/p Procedure(s): HYSTERECTOMY ABDOMINAL SALPINGO OOPHERECTOMY: progressing well  Plan: Discharge home  LOS: 2 days    Laquasha Groome M 12/13/2011, 8:00 AM

## 2011-12-26 ENCOUNTER — Inpatient Hospital Stay (HOSPITAL_COMMUNITY): Payer: BC Managed Care – PPO

## 2011-12-26 ENCOUNTER — Inpatient Hospital Stay (HOSPITAL_COMMUNITY)
Admission: AD | Admit: 2011-12-26 | Discharge: 2011-12-28 | DRG: 418 | Disposition: A | Discharge status: Home / Self Care | Payer: BC Managed Care – PPO | Source: Ambulatory Visit | Attending: Obstetrics and Gynecology | Admitting: Obstetrics and Gynecology

## 2011-12-26 ENCOUNTER — Encounter (HOSPITAL_COMMUNITY): Payer: Self-pay | Admitting: *Deleted

## 2011-12-26 DIAGNOSIS — T8140XA Infection following a procedure, unspecified, initial encounter: Principal | ICD-10-CM | POA: Diagnosis present

## 2011-12-26 DIAGNOSIS — N731 Chronic parametritis and pelvic cellulitis: Secondary | ICD-10-CM | POA: Diagnosis present

## 2011-12-26 DIAGNOSIS — Y836 Removal of other organ (partial) (total) as the cause of abnormal reaction of the patient, or of later complication, without mention of misadventure at the time of the procedure: Secondary | ICD-10-CM | POA: Diagnosis present

## 2011-12-26 HISTORY — DX: Headache: R51

## 2011-12-26 LAB — URINE MICROSCOPIC-ADD ON

## 2011-12-26 LAB — DIFFERENTIAL
Basophils Absolute: 0 10*3/uL (ref 0.0–0.1)
Lymphocytes Relative: 17 % (ref 12–46)
Lymphs Abs: 2.2 10*3/uL (ref 0.7–4.0)
Monocytes Absolute: 1.2 10*3/uL — ABNORMAL HIGH (ref 0.1–1.0)
Monocytes Relative: 9 % (ref 3–12)
Neutro Abs: 9.6 10*3/uL — ABNORMAL HIGH (ref 1.7–7.7)

## 2011-12-26 LAB — URINALYSIS, ROUTINE W REFLEX MICROSCOPIC
Bilirubin Urine: NEGATIVE
Glucose, UA: NEGATIVE mg/dL
Nitrite: NEGATIVE
Specific Gravity, Urine: <1.005 — ABNORMAL LOW (ref 1.005–1.030)
pH: 5.5 (ref 5.0–8.0)

## 2011-12-26 LAB — CBC
HCT: 30.1 % — ABNORMAL LOW (ref 36.0–46.0)
Hemoglobin: 9.1 g/dL — ABNORMAL LOW (ref 12.0–15.0)
RBC: 3.85 MIL/uL — ABNORMAL LOW (ref 3.87–5.11)
WBC: 13 10*3/uL — ABNORMAL HIGH (ref 4.0–10.5)

## 2011-12-26 MED ORDER — KETOROLAC TROMETHAMINE 30 MG/ML IJ SOLN
30.0000 mg | Freq: Four times a day (QID) | INTRAMUSCULAR | Status: DC
  Administered 2011-12-26 – 2011-12-28 (×6): 30 mg via INTRAVENOUS
  Filled 2011-12-26 (×7): qty 1

## 2011-12-26 MED ORDER — LACTATED RINGERS IV SOLN
INTRAVENOUS | Status: DC
  Administered 2011-12-26 – 2011-12-27 (×2): via INTRAVENOUS

## 2011-12-26 MED ORDER — GENTAMICIN SULFATE 40 MG/ML IJ SOLN
Freq: Three times a day (TID) | INTRAVENOUS | Status: DC
  Administered 2011-12-27 (×2): via INTRAVENOUS
  Filled 2011-12-26 (×3): qty 3.5

## 2011-12-26 MED ORDER — CLINDAMYCIN PHOSPHATE 900 MG/50ML IV SOLN: 900.0000 mg | Freq: Four times a day (QID) | INTRAVENOUS | Status: DC

## 2011-12-26 MED ORDER — GENTAMICIN SULFATE 40 MG/ML IJ SOLN
Freq: Once | INTRAVENOUS | Status: AC
  Administered 2011-12-26: 17:00:00 via INTRAVENOUS
  Filled 2011-12-26: qty 4

## 2011-12-26 MED ORDER — SODIUM CHLORIDE 0.9 % IV SOLN
2.0000 g | Freq: Once | INTRAVENOUS | Status: AC
  Administered 2011-12-26: 2 g via INTRAVENOUS
  Filled 2011-12-26: qty 2000

## 2011-12-26 MED ORDER — ONDANSETRON HCL 4 MG/2ML IJ SOLN: 4.0000 mg | Freq: Four times a day (QID) | INTRAMUSCULAR | Status: DC | PRN

## 2011-12-26 MED ORDER — IOHEXOL 300 MG/ML  SOLN
100.0000 mL | Freq: Once | INTRAMUSCULAR | Status: AC | PRN
  Administered 2011-12-26: 100 mL via INTRAVENOUS

## 2011-12-26 MED ORDER — SODIUM CHLORIDE 0.9 % IV SOLN
3.0000 g | Freq: Four times a day (QID) | INTRAVENOUS | Status: DC
  Administered 2011-12-26 – 2011-12-28 (×7): 3 g via INTRAVENOUS
  Filled 2011-12-26 (×8): qty 3

## 2011-12-26 MED ORDER — CLINDAMYCIN PHOSPHATE 900 MG/50ML IV SOLN: 900.0000 mg | Freq: Three times a day (TID) | INTRAVENOUS | Status: DC

## 2011-12-26 MED ORDER — DOCUSATE SODIUM 100 MG PO CAPS
100.0000 mg | ORAL_CAPSULE | Freq: Two times a day (BID) | ORAL | Status: DC
  Administered 2011-12-26 – 2011-12-27 (×3): 100 mg via ORAL
  Filled 2011-12-26 (×3): qty 1

## 2011-12-26 MED ORDER — ONDANSETRON HCL 4 MG PO TABS: 4.0000 mg | ORAL_TABLET | Freq: Four times a day (QID) | ORAL | Status: DC | PRN

## 2011-12-26 MED ORDER — PRENATAL MULTIVITAMIN CH
1.0000 | ORAL_TABLET | Freq: Every day | ORAL | Status: DC
  Administered 2011-12-27: 1 via ORAL
  Filled 2011-12-26: qty 1

## 2011-12-26 MED ORDER — GENTAMICIN SULFATE 40 MG/ML IJ SOLN
Freq: Three times a day (TID) | INTRAVENOUS | Status: DC
  Filled 2011-12-26: qty 3.5

## 2011-12-26 MED ORDER — SODIUM CHLORIDE 0.9 % IV SOLN
INTRAVENOUS | Status: DC
Start: 2011-12-26 — End: 2011-12-28
  Administered 2011-12-26: 16:00:00 via INTRAVENOUS

## 2011-12-26 MED ORDER — KETOROLAC TROMETHAMINE 30 MG/ML IJ SOLN: 30.0000 mg | Freq: Four times a day (QID) | INTRAMUSCULAR | Status: DC

## 2011-12-26 NOTE — Progress Notes (Signed)
Patient states she had an abdominal hysterectomy on 1-23 done by Dr. Marcelle Overlie. Has been seen in the office twice for the pain she started having on the right lower quadrant of the abdomen after the surgery. Once told it was muscle strain and this past Monday was treated for a UTI. Yesterday started to have vomiting and has been having chills. Abdominal pain continues and has been having some back pain. Started to have a small amount of bleeding yesterday. Started having a feeling of something lodged in her throat and has a really dry mouth that started yesterday.

## 2011-12-26 NOTE — Progress Notes (Signed)
36 year old s/p TAH BSO admitted tonight with early pelvic abscess. Plan of care discussed with patient. Will admit and place on Unasyn/ Gent/Clinda regimen with repeat CBC in the am. All questions are answered.

## 2011-12-26 NOTE — Progress Notes (Signed)
Wende Bushy, NP at bedside.  Discussing poc with pt.  Radiology at bedside to give pt contrast.

## 2011-12-26 NOTE — Progress Notes (Signed)
Pt to room 319 in wheelchair via hospital staff.

## 2011-12-26 NOTE — ED Provider Notes (Signed)
History     Chief Complaint  Patient presents with  . Abdominal Pain   HPI Pt is not pregnant G0 s/p hysterectomy and ?BSO for endometriois by Dr. Marcelle Overlie on 12/11/2011.  Pt was seen on Fri 2/1 with RLQ pain and was told it was a nerve and was injected in the office.  On Tuesday 2/5 she was seen with RLQ pain and pain with urination and lower mid abdominal pain and was treated for a bladder infection with Cipro, which she is still taking.  She has had difficulty urinating and pain with her bowel movements, with a lot of pressure. She has been taking Activia which has helped her bowel movemnts.  She had a bowel movement last night and also this morning. She has been nauseated and vomiting.  This morning she had orange juice and threw it up.  She is taking Ibuprofen mostly for pain, which helps some; limited narcotics.  She has had chills and hot since her surgery- no hormone therapy.    Past Medical History  Diagnosis Date  . Fibroid, uterus   . Chronic pelvic pain in female   . Anemia   . Blood transfusion 2000  . Active smoker     Past Surgical History  Procedure Date  . Hysteroscopy w/d&c 10-26-10    polypectomy  . Ganglion cyst excision age 62    right wrist  . Laparoscopy 10/01/2011    Procedure: LAPAROSCOPY DIAGNOSTIC;  Surgeon: Meriel Pica;  Location: [facility];  Service: Gynecology;  Laterality: N/A;  . Abdominal hysterectomy 12/11/2011    Procedure: HYSTERECTOMY ABDOMINAL;  Surgeon: Meriel Pica, MD;  Location: [facility];  Service: Gynecology;  Laterality: N/A;  . Salpingoophorectomy 12/11/2011    Procedure: SALPINGO OOPHERECTOMY;  Surgeon: Meriel Pica, MD;  Location: [facility];  Service: Gynecology;  Laterality: Bilateral;    No family history on file.  History  Substance Use Topics  . Smoking status: Current Everyday Smoker -- 1.0 packs/day for 6 years    Types: Cigarettes  . Smokeless tobacco: Never Used  . Alcohol Use: No    Allergies:    Allergies  Allergen Reactions  . Percocet (Oxycodone-Acetaminophen) Itching and Other (See Comments)    Itching in mouth; lightheadedness    Prescriptions prior to admission  Medication Sig Dispense Refill  . ciprofloxacin (CIPRO) 500 MG tablet Take 500 mg by mouth 2 (two) times daily. Called pharmacy and patient got #10 filled on 12-23-11      . HYDROcodone-acetaminophen (NORCO) 5-325 MG per tablet Take 1 tablet by mouth every 6 (six) hours as needed. For pain      . ibuprofen (ADVIL,MOTRIN) 800 MG tablet Take 800 mg by mouth every 8 (eight) hours as needed. pain        ROS Physical Exam   Blood pressure 120/76, pulse 96, temperature 98.8 F (37.1 C), temperature source Oral, resp. rate 16, height 5\' 5"  (1.651 m), weight 211 lb (95.709 kg), last menstrual period 12/14/2010, SpO2 99.00%.  Physical Exam  Nursing note and vitals reviewed. Constitutional: She is oriented to person, place, and time. She appears well-developed and well-nourished.  HENT:  Head: Normocephalic.  Eyes: Pupils are equal, round, and reactive to light.  Neck: Normal range of motion. Neck supple.  Cardiovascular: Normal rate.   Respiratory: Effort normal.  GI: Soft. Bowel sounds are normal. She exhibits no distension. There is tenderness. There is guarding. There is no rebound.  Musculoskeletal: Normal range of motion.  Neurological: She is alert and oriented to person, place, and time.  Skin: Skin is warm and dry.  Psychiatric: She has a normal mood and affect.    MAU Course  Procedures Results for orders placed during the hospital encounter of 12/26/11 (from the past 24 hour(s))  URINALYSIS, ROUTINE W REFLEX MICROSCOPIC     Status: Abnormal   Collection Time   12/26/11 12:55 PM      Component Value Range   Color, Urine YELLOW  YELLOW    APPearance HAZY (*) CLEAR    Specific Gravity, Urine <1.005 (*) 1.005 - 1.030    pH 5.5  5.0 - 8.0    Glucose, UA NEGATIVE  NEGATIVE (mg/dL)   Hgb urine dipstick  LARGE (*) NEGATIVE    Bilirubin Urine NEGATIVE  NEGATIVE    Ketones, ur NEGATIVE  NEGATIVE (mg/dL)   Protein, ur NEGATIVE  NEGATIVE (mg/dL)   Urobilinogen, UA 0.2  0.0 - 1.0 (mg/dL)   Nitrite NEGATIVE  NEGATIVE    Leukocytes, UA SMALL (*) NEGATIVE   URINE MICROSCOPIC-ADD ON     Status: Abnormal   Collection Time   12/26/11 12:55 PM      Component Value Range   Squamous Epithelial / LPF MANY (*) RARE    WBC, UA 11-20  <3 (WBC/hpf)   RBC / HPF 0-2  <3 (RBC/hpf)   Bacteria, UA FEW (*) RARE   CBC     Status: Abnormal   Collection Time   12/26/11  1:55 PM      Component Value Range   WBC 13.0 (*) 4.0 - 10.5 (K/uL)   RBC 3.85 (*) 3.87 - 5.11 (MIL/uL)   Hemoglobin 9.1 (*) 12.0 - 15.0 (g/dL)   HCT 28.4 (*) 13.2 - 46.0 (%)   MCV 78.2  78.0 - 100.0 (fL)   MCH 23.6 (*) 26.0 - 34.0 (pg)   MCHC 30.2  30.0 - 36.0 (g/dL)   RDW 44.0  10.2 - 72.5 (%)   Platelets 584 (*) 150 - 400 (K/uL)  DIFFERENTIAL     Status: Abnormal   Collection Time   12/26/11  1:55 PM      Component Value Range   Neutrophils Relative 74  43 - 77 (%)   Neutro Abs 9.6 (*) 1.7 - 7.7 (K/uL)   Lymphocytes Relative 17  12 - 46 (%)   Lymphs Abs 2.2  0.7 - 4.0 (K/uL)   Monocytes Relative 9  3 - 12 (%)   Monocytes Absolute 1.2 (*) 0.1 - 1.0 (K/uL)   Eosinophils Relative 1  0 - 5 (%)   Eosinophils Absolute 0.1  0.0 - 0.7 (K/uL)   Basophils Relative 0  0 - 1 (%)   Basophils Absolute 0.0  0.0 - 0.1 (K/uL)  discussed labs and pt's clinical status to Dr. Vincente Poli- CT of abdomen and pelvis ordered w/wo contrast; IV with amp/gent/clindamycin ordered for probable pelvic abcess; discussed with pt Liver, spleen, pancreas, and adrenal glands are within normal  limits.  Gallbladder is unremarkable. No intrahepatic or extrahepatic  ductal dilatation.  Kidneys are within normal limits. Extrarenal pelvis on the left.  No hydronephrosis.  No evidence of bowel obstruction. Normal appendix.  No evidence of abdominal aortic aneurysm.  No  suspicious abdominopelvic lymphadenopathy.  Status post hysterectomy. No adnexal masses.  3.3 x 7.2 cm fluid collection in the pelvis (series 2/image 75)  with developing thin rim (series 2/image 73), worrisome for early  abscess formation.  Bladder is underdistended.  Postsurgical changes in the lower anterior abdominal wall.  Visualized osseous structures are within normal limits.  IMPRESSION:  Status post hysterectomy.  3.3 x 7.2 cm fluid collection in the pelvis with a developing thin  rim, worrisome for early abscess formation.  No evidence of bowel obstruction. Normal appendix.  Original Report Authenticated By: Charline Bills, M.D.  Dr. Vincente Poli here to see pt and admit.  Assessment and Plan    Stacey Weeks 12/26/2011, 1:32 PM

## 2011-12-26 NOTE — Progress Notes (Signed)
ANTIBIOTIC CONSULT NOTE - INITIAL  Pharmacy Consult for Gentamicin Indication: Post-op Pelvic Abscess  Allergies  Allergen Reactions  . Percocet (Oxycodone-Acetaminophen) Itching and Other (See Comments)    Itching in mouth; lightheadedness    Patient Measurements: Height: 5\' 5"  (165.1 cm) Weight: 211 lb (95.709 kg) IBW/kg (Calculated) : 57 kg Adjusted Body Weight: 69 kg  Vital Signs: Temp: 98.2 F (36.8 C) (02/07 1334) Temp src: Oral (02/07 1334) BP: 128/57 mmHg (02/07 1334) Pulse Rate: 75  (02/07 1334)  Labs:  Basename 12/26/11 1355  WBC 13.0*  HGB 9.1*  PLT 584*  LABCREA --  CREATININE --  CRCLEARANCE --    Microbiology: Recent Results (from the past 720 hour(s))  SURGICAL PCR SCREEN     Status: Normal   Collection Time   12/10/11  2:14 PM      Component Value Range Status Comment   MRSA, PCR NEGATIVE  NEGATIVE  Final    Staphylococcus aureus NEGATIVE  NEGATIVE  Final     Medications:  Ampicillin 2 grams IV x1 Clindamycin 900mg  IV Q 8 hr  Assessment: Estimated Ke = 0.295;  Vd = 0.3 L/kg  Goal of Therapy:  Gentamicin peak 6-8 mg/L and Trough < 1 mg/L  Plan:  Gentamicin 160 mg IV x 1  Gentamicin 140 mg IV every 8 hrs  Check Scr with next labs if gentamicin continued. Will check gentamicin levels if continued > 72hr or clinically indicated.  Natasha Bence 12/26/2011,3:32 PM

## 2011-12-26 NOTE — Progress Notes (Signed)
Pt may go to room 319

## 2011-12-26 NOTE — Progress Notes (Signed)
Dr. Vincente Poli at bedside.  Assessment done and poc discussed with pt.

## 2011-12-27 LAB — CREATININE, SERUM
Creatinine, Ser: 1.26 mg/dL — ABNORMAL HIGH (ref 0.50–1.10)
GFR calc non Af Amer: 55 mL/min — ABNORMAL LOW (ref >90)

## 2011-12-27 LAB — DIFFERENTIAL
Basophils Relative: 0 % (ref 0–1)
Eosinophils Absolute: 0.2 10*3/uL (ref 0.0–0.7)
Lymphs Abs: 2.7 10*3/uL (ref 0.7–4.0)
Neutrophils Relative %: 62 % (ref 43–77)

## 2011-12-27 LAB — CBC
MCH: 23.7 pg — ABNORMAL LOW (ref 26.0–34.0)
MCHC: 30.2 g/dL (ref 30.0–36.0)
Platelets: 611 10*3/uL — ABNORMAL HIGH (ref 150–400)
RBC: 3.5 MIL/uL — ABNORMAL LOW (ref 3.87–5.11)

## 2011-12-27 MED ORDER — CLINDAMYCIN PHOSPHATE 900 MG/50ML IV SOLN
900.0000 mg | Freq: Three times a day (TID) | INTRAVENOUS | Status: DC
  Administered 2011-12-27 – 2011-12-28 (×3): 900 mg via INTRAVENOUS
  Filled 2011-12-27 (×4): qty 50

## 2011-12-27 MED ORDER — GENTAMICIN SULFATE 40 MG/ML IJ SOLN
130.0000 mg | Freq: Two times a day (BID) | INTRAVENOUS | Status: DC
  Administered 2011-12-27 – 2011-12-28 (×2): 130 mg via INTRAVENOUS
  Filled 2011-12-27 (×2): qty 3.25

## 2011-12-27 NOTE — Progress Notes (Signed)
Subjective: Patient reports tolerating PO.    Objective: I have reviewed patient's vital signs, intake and output, medications and labs.  abd:+ BS, soft, nontender    Assessment/Plan: Post op TAHBSO/ pelvic abscess by CT  afeb today with normal CBC, will cont IV ABX another 24 hrs and poss D/C tomorrow on PO ABX  LOS: 1 day    Ramesha Poster M 12/27/2011, 8:00 AM

## 2011-12-27 NOTE — Progress Notes (Signed)
UR Chart review completed.  

## 2011-12-27 NOTE — Progress Notes (Signed)
Pt started on gentamicin yesterday of 140 mg IV every 8 hours for a pelvic abscess.   Based on serum creatinine this morning of 1.26mg /dL will change dose to 119 mg every 12 hours to for C peak of 6-8 mg/L and a trough of 5mg /L.  Pt did received contrast yesterday, but she is on NS at 125 ml/hr with no history of vomiting so she shouldn't be dehydrated.  Per physicians note, the plan is for possible d/c on po antibiotics tomorrow.  Will follow the patient closely and get levels as indicated.    Hurley Cisco 12/27/2011.

## 2011-12-28 MED ORDER — METRONIDAZOLE 500 MG PO TABS: 500.0000 mg | ORAL_TABLET | Freq: Three times a day (TID) | ORAL | Status: AC

## 2011-12-28 MED ORDER — AMOXICILLIN-POT CLAVULANATE 500-125 MG PO TABS: 1.0000 | ORAL_TABLET | Freq: Three times a day (TID) | ORAL | Status: AC

## 2011-12-28 MED ORDER — HYDROCODONE-ACETAMINOPHEN 5-325 MG PO TABS: 1.0000 | ORAL_TABLET | Freq: Four times a day (QID) | ORAL | Status: AC | PRN

## 2011-12-28 NOTE — Progress Notes (Signed)
Pt d/c home with family, ambulatory in private car. D/C instructions and prescriptions reviewed with pt. Pt verbalized understanding.

## 2011-12-28 NOTE — Discharge Summary (Signed)
Physician Discharge Summary  Patient ID: Stacey Weeks MRN: [id number] DOB/AGE: [birth year] 35 y.o.  Admit date: 12/26/2011 Discharge date: 12/28/2011  Admission Diagnoses:  Discharge Diagnoses:  Active Problems:  * No active hospital problems. *    Discharged Condition: good  Hospital Course: adm from MAU c/o pelvic pain, CT showed early signs of fluid collection, poss pelvic abscess, adm on triple ABX , remained afeb with clinical improvement in decreased abd pain  Consults: None  Significant Diagnostic Studies: radiology: CT scan: pelvic fluid collection, early abscess  Treatments: antibiotics: gentamycin, Unasyn and metronidazole  Discharge Exam: Blood pressure 132/76, pulse 79, temperature 98.4 F (36.9 C), temperature source Oral, resp. rate 19, height 5\' 5"  (1.651 m), weight 215 lb (97.523 kg), last menstrual period 12/14/2010, SpO2 97.00%. Incision/Wound:C/D, abd soft + BS  Disposition: Home or Self Care   Medication List  As of 12/28/2011  9:08 AM   STOP taking these medications         ciprofloxacin 500 MG tablet         TAKE these medications         amoxicillin-clavulanate 500-125 MG per tablet   Commonly known as: AUGMENTIN   Take 1 tablet (500 mg total) by mouth 3 (three) times daily.      HYDROcodone-acetaminophen 5-325 MG per tablet   Commonly known as: NORCO   Take 1 tablet by mouth every 6 (six) hours as needed. For pain      HYDROcodone-acetaminophen 5-325 MG per tablet   Commonly known as: NORCO   Take 1 tablet by mouth every 6 (six) hours as needed for pain.      ibuprofen 800 MG tablet   Commonly known as: ADVIL,MOTRIN   Take 800 mg by mouth every 8 (eight) hours as needed. pain      metroNIDAZOLE 500 MG tablet   Commonly known as: FLAGYL   Take 1 tablet (500 mg total) by mouth 3 (three) times daily.           Follow-up Information    Please follow up. (office will call)          Signed: Meriel Pica 12/28/2011, 9:08  AM

## 2011-12-28 NOTE — Progress Notes (Signed)
Subjective: Patient reports tolerating PO.    Objective: I have reviewed patient's vital signs, medications and labs.  Inc C/D, abd soft + BS, no rebound   Assessment/Plan: Feeling much improved, will DC on PO ABX  LOS: 2 days    Sara Selvidge M 12/28/2011, 9:03 AM

## 2012-03-02 ENCOUNTER — Encounter (HOSPITAL_BASED_OUTPATIENT_CLINIC_OR_DEPARTMENT_OTHER): Payer: Self-pay | Admitting: *Deleted

## 2012-03-02 ENCOUNTER — Emergency Department (HOSPITAL_BASED_OUTPATIENT_CLINIC_OR_DEPARTMENT_OTHER)
Admission: EM | Admit: 2012-03-02 | Discharge: 2012-03-02 | Disposition: A | Discharge status: Home / Self Care | Payer: Worker's Compensation | Attending: Emergency Medicine | Admitting: Emergency Medicine

## 2012-03-02 DIAGNOSIS — F17200 Nicotine dependence, unspecified, uncomplicated: Secondary | ICD-10-CM | POA: Insufficient documentation

## 2012-03-02 DIAGNOSIS — IMO0002 Reserved for concepts with insufficient information to code with codable children: Secondary | ICD-10-CM | POA: Insufficient documentation

## 2012-03-02 DIAGNOSIS — Y990 Civilian activity done for income or pay: Secondary | ICD-10-CM | POA: Insufficient documentation

## 2012-03-02 DIAGNOSIS — S8391XA Sprain of unspecified site of right knee, initial encounter: Secondary | ICD-10-CM

## 2012-03-02 DIAGNOSIS — X500XXA Overexertion from strenuous movement or load, initial encounter: Secondary | ICD-10-CM | POA: Insufficient documentation

## 2012-03-02 MED ORDER — IBUPROFEN 800 MG PO TABS: 800.0000 mg | ORAL_TABLET | Freq: Three times a day (TID) | ORAL | Status: AC | PRN

## 2012-03-02 MED ORDER — IBUPROFEN 800 MG PO TABS
800.0000 mg | ORAL_TABLET | Freq: Once | ORAL | Status: AC
  Administered 2012-03-02: 800 mg via ORAL
  Filled 2012-03-02: qty 1

## 2012-03-02 NOTE — ED Notes (Signed)
Tripped over a mat at work [employer]. Pain in the back of her right leg.

## 2012-03-02 NOTE — ED Provider Notes (Signed)
This chart was scribed for Cyndra Numbers, MD by Wallis Mart. The patient was seen in room MH03/MH03 and the patient's care was started at 11:15 PM.   CSN: [id number]  Arrival date & time 03/02/12  2039   First MD Initiated Contact with Patient 03/02/12 2310      No chief complaint on file.   (Consider location/radiation/quality/duration/timing/severity/associated sxs/prior treatment) HPI . Stacey Weeks is a 36 y.o. female who presents to the Emergency Department complaining of knee pain. Pt tripped over a mat at work tonight, caught herself with her hands, and now feels pain in the back of her right leg. Pt rates the pain as a 7/10. Pt has taken no medicine for the pain. There are no other associated symptoms and no other alleviating or aggravating factors.  Past Medical History  Diagnosis Date  . Fibroid, uterus   . Chronic pelvic pain in female   . Anemia   . Blood transfusion 2000  . Active smoker   . Endometriosis   . Headache     Past Surgical History  Procedure Date  . Hysteroscopy w/d&c 10-26-10    polypectomy  . Ganglion cyst excision age 48    right wrist  . Laparoscopy 10/01/2011    Procedure: LAPAROSCOPY DIAGNOSTIC;  Surgeon: Meriel Pica;  Location: [facility];  Service: Gynecology;  Laterality: N/A;  . Abdominal hysterectomy 12/11/2011    Procedure: HYSTERECTOMY ABDOMINAL;  Surgeon: Meriel Pica, MD;  Location: [facility];  Service: Gynecology;  Laterality: N/A;  . Salpingoophorectomy 12/11/2011    Procedure: SALPINGO OOPHERECTOMY;  Surgeon: Meriel Pica, MD;  Location: [facility];  Service: Gynecology;  Laterality: Bilateral;    Family History  Problem Relation Age of Onset  . Diabetes Maternal Grandmother   . Hypertension Maternal Grandmother   . Heart disease Maternal Grandmother     History  Substance Use Topics  . Smoking status: Current Everyday Smoker -- 0.5 packs/day for 6 years    Types: Cigarettes  . Smokeless  tobacco: Never Used  . Alcohol Use: No    OB History    Grav Para Term Preterm Abortions TAB SAB Ect Mult Living   0               Review of Systems  Constitutional: Negative.   HENT: Negative.   Eyes: Negative.   Cardiovascular: Negative.   Gastrointestinal: Negative.   Genitourinary: Negative.   Musculoskeletal:       Right knee pain  Skin: Negative.   Neurological: Negative.   Hematological: Negative.   Psychiatric/Behavioral: Negative.   All other systems reviewed and are negative.    10 Systems reviewed and all are negative for acute change except as noted in the HPI.    Allergies  Percocet  Home Medications   Current Outpatient Rx  Name Route Sig Dispense Refill  . HYDROCODONE-ACETAMINOPHEN 5-325 MG PO TABS Oral Take 1 tablet by mouth every 6 (six) hours as needed. For pain    . IBUPROFEN 800 MG PO TABS Oral Take 800 mg by mouth every 8 (eight) hours as needed. pain      BP 128/66  Pulse 89  Temp(Src) 98.2 F (36.8 C) (Oral)  Resp 20  SpO2 100%  LMP 12/14/2010  Physical Exam  Nursing note and vitals reviewed. Constitutional: She is oriented to person, place, and time. She appears well-developed and well-nourished. No distress.  HENT:  Head: Normocephalic and atraumatic.  Eyes: EOM are normal. Pupils  are equal, round, and reactive to light.  Neck: Neck supple. No tracheal deviation present.  Cardiovascular: Normal rate, regular rhythm and normal heart sounds.   Pulmonary/Chest: Effort normal and breath sounds normal. No respiratory distress.  Abdominal: Soft. She exhibits no distension.  Musculoskeletal: Normal range of motion. She exhibits no edema.       no effusion, no tenderness to palpation, some tenderness on manipulation of knee, but no ligamentous instability   Neurological: She is alert and oriented to person, place, and time. No sensory deficit.  Skin: Skin is warm and dry.  Psychiatric: She has a normal mood and affect. Her behavior is  normal.    ED Course  Procedures (including critical care time) DIAGNOSTIC STUDIES: Oxygen Saturation is 100% on room air, normal by my interpretation.    COORDINATION OF CARE:    Labs Reviewed - No data to display No results found.   1. Right knee sprain       MDM  Patient was evaluated by myself. Based on presentation patient had very low likelihood for bony injury to the right knee. Her presentation was more consistent with a ligamentous injury or sprain. Patient never fell to the floor. She caught herself with her hands. Patient had discomfort on my manipulation the knee but no ligamentous instability. She had no joint effusion or tenderness palpation otherwise. Patient was given ibuprofen here. She was instructed that she can followup with Dr. Norton Blizzard if symptoms persist. Otherwise she can just continue using ibuprofen and ice as needed for her symptoms. The patient was discharged in good condition.    I personally performed the services described in this documentation, which was scribed in my presence. The recorded information has been reviewed and considered.       Cyndra Numbers, MD 03/03/12 [phone]

## 2012-03-02 NOTE — Discharge Instructions (Signed)
Joint Sprain A sprain is a tear or stretch in the ligaments that hold a joint together. Severe sprains may need as long as 3-6 weeks of immobilization and/or exercises to heal completely. Sprained joints should be rested and protected. If not, they can become unstable and prone to re-injury. Proper treatment can reduce your pain, shorten the period of disability, and reduce the risk of repeated injuries. TREATMENT   Rest and elevate the injured joint to reduce pain and swelling.   Apply ice packs to the injury for 20-30 minutes every 2-3 hours for the next 2-3 days.   Keep the injury wrapped in a compression bandage or splint as long as the joint is painful or as instructed by your caregiver.   Do not use the injured joint until it is completely healed to prevent re-injury and chronic instability. Follow the instructions of your caregiver.   Long-term sprain management may require exercises and/or treatment by a physical therapist. Taping or special braces may help stabilize the joint until it is completely better.  SEEK MEDICAL CARE IF:   You develop increased pain or swelling of the joint.   You develop increasing redness and warmth of the joint.   You develop a fever.   It becomes stiff.   Your hand or foot gets cold or numb.  Document Released: 12/12/2004 Document Revised: 10/24/2011 Document Reviewed: 11/21/2008 [facility] Patient Information 2012 [place], [place].Knee Pain The knee is the complex joint between your thigh and your lower leg. It is made up of bones, tendons, ligaments, and cartilage. The bones that make up the knee are:  The femur in the thigh.   The tibia and fibula in the lower leg.   The patella or kneecap riding in the groove on the lower femur.  CAUSES  Knee pain is a common complaint with many causes. A few of these causes are:  Injury, such as:   A ruptured ligament or tendon injury.   Torn cartilage.   Medical conditions, such as:   Gout    Arthritis   Infections   Overuse, over training or overdoing a physical activity.  Knee pain can be minor or severe. Knee pain can accompany debilitating injury. Minor knee problems often respond well to self-care measures or get well on their own. More serious injuries may need medical intervention or even surgery. SYMPTOMS The knee is complex. Symptoms of knee problems can vary widely. Some of the problems are:  Pain with movement and weight bearing.   Swelling and tenderness.   Buckling of the knee.   Inability to straighten or extend your knee.   Your knee locks and you cannot straighten it.   Warmth and redness with pain and fever.   Deformity or dislocation of the kneecap.  DIAGNOSIS  Determining what is wrong may be very straight forward such as when there is an injury. It can also be challenging because of the complexity of the knee. Tests to make a diagnosis may include:  Your caregiver taking a history and doing a physical exam.   Routine X-rays can be used to rule out other problems. X-rays will not reveal a cartilage tear. Some injuries of the knee can be diagnosed by:   Arthroscopy a surgical technique by which a small video camera is inserted through tiny incisions on the sides of the knee. This procedure is used to examine and repair internal knee joint problems. Tiny instruments can be used during arthroscopy to repair the torn knee cartilage (  meniscus).   Arthrography is a radiology technique. A contrast liquid is directly injected into the knee joint. Internal structures of the knee joint then become visible on X-ray film.   An MRI scan is a non x-ray radiology procedure in which magnetic fields and a computer produce two- or three-dimensional images of the inside of the knee. Cartilage tears are often visible using an MRI scanner. MRI scans have largely replaced arthrography in diagnosing cartilage tears of the knee.   Blood work.   Examination of the fluid  that helps to lubricate the knee joint (synovial fluid). This is done by taking a sample out using a needle and a syringe.  TREATMENT The treatment of knee problems depends on the cause. Some of these treatments are:  Depending on the injury, proper casting, splinting, surgery or physical therapy care will be needed.   Give yourself adequate recovery time. Do not overuse your joints. If you begin to get sore during workout routines, back off. Slow down or do fewer repetitions.   For repetitive activities such as cycling or running, maintain your strength and nutrition.   Alternate muscle groups. For example if you are a weight lifter, work the upper body on one day and the lower body the next.   Either tight or weak muscles do not give the proper support for your knee. Tight or weak muscles do not absorb the stress placed on the knee joint. Keep the muscles surrounding the knee strong.   Take care of mechanical problems.   If you have flat feet, orthotics or special shoes may help. See your caregiver if you need help.   Arch supports, sometimes with wedges on the inner or outer aspect of the heel, can help. These can shift pressure away from the side of the knee most bothered by osteoarthritis.   A brace called an "unloader" brace also may be used to help ease the pressure on the most arthritic side of the knee.   If your caregiver has prescribed crutches, braces, wraps or ice, use as directed. The acronym for this is PRICE. This means protection, rest, ice, compression and elevation.   Nonsteroidal anti-inflammatory drugs (NSAID's), can help relieve pain. But if taken immediately after an injury, they may actually increase swelling. Take NSAID's with food in your stomach. Stop them if you develop stomach problems. Do not take these if you have a history of ulcers, stomach pain or bleeding from the bowel. Do not take without your caregiver's approval if you have problems with fluid retention,  heart failure, or kidney problems.   For ongoing knee problems, physical therapy may be helpful.   Glucosamine and chondroitin are over-the-counter dietary supplements. Both may help relieve the pain of osteoarthritis in the knee. These medicines are different from the usual anti-inflammatory drugs. Glucosamine may decrease the rate of cartilage destruction.   Injections of a corticosteroid drug into your knee joint may help reduce the symptoms of an arthritis flare-up. They may provide pain relief that lasts a few months. You may have to wait a few months between injections. The injections do have a small increased risk of infection, water retention and elevated blood sugar levels.   Hyaluronic acid injected into damaged joints may ease pain and provide lubrication. These injections may work by reducing inflammation. A series of shots may give relief for as long as 6 months.   Topical painkillers. Applying certain ointments to your skin may help relieve the pain and stiffness of osteoarthritis.  Ask your pharmacist for suggestions. Many over the-counter products are approved for temporary relief of arthritis pain.   In some countries, doctors often prescribe topical NSAID's for relief of chronic conditions such as arthritis and tendinitis. A review of treatment with NSAID creams found that they worked as well as oral medications but without the serious side effects.  PREVENTION  Maintain a healthy weight. Extra pounds put more strain on your joints.   Get strong, stay limber. Weak muscles are a common cause of knee injuries. Stretching is important. Include flexibility exercises in your workouts.   Be smart about exercise. If you have osteoarthritis, chronic knee pain or recurring injuries, you may need to change the way you exercise. This does not mean you have to stop being active. If your knees ache after jogging or playing basketball, consider switching to swimming, water aerobics or other  low-impact activities, at least for a few days a week. Sometimes limiting high-impact activities will provide relief.   Make sure your shoes fit well. Choose footwear that is right for your sport.   Protect your knees. Use the proper gear for knee-sensitive activities. Use kneepads when playing volleyball or laying carpet. Buckle your seat belt every time you drive. Most shattered kneecaps occur in car accidents.   Rest when you are tired.  SEEK MEDICAL CARE IF:  You have knee pain that is continual and does not seem to be getting better.  SEEK IMMEDIATE MEDICAL CARE IF:  Your knee joint feels hot to the touch and you have a high fever. MAKE SURE YOU:   Understand these instructions.   Will watch your condition.   Will get help right away if you are not doing well or get worse.  Document Released: 09/01/2007 Document Revised: 10/24/2011 Document Reviewed: 09/01/2007 [facility] Patient Information 2012 [place], [place].

## 2012-07-01 ENCOUNTER — Emergency Department (HOSPITAL_BASED_OUTPATIENT_CLINIC_OR_DEPARTMENT_OTHER): Payer: BC Managed Care – PPO

## 2012-07-01 ENCOUNTER — Encounter (HOSPITAL_BASED_OUTPATIENT_CLINIC_OR_DEPARTMENT_OTHER): Payer: Self-pay | Admitting: Emergency Medicine

## 2012-07-01 ENCOUNTER — Emergency Department (HOSPITAL_BASED_OUTPATIENT_CLINIC_OR_DEPARTMENT_OTHER)
Admission: EM | Admit: 2012-07-01 | Discharge: 2012-07-01 | Disposition: A | Discharge status: Home / Self Care | Payer: BC Managed Care – PPO | Attending: Emergency Medicine | Admitting: Emergency Medicine

## 2012-07-01 DIAGNOSIS — R209 Unspecified disturbances of skin sensation: Secondary | ICD-10-CM | POA: Insufficient documentation

## 2012-07-01 DIAGNOSIS — R5381 Other malaise: Secondary | ICD-10-CM | POA: Insufficient documentation

## 2012-07-01 DIAGNOSIS — R3129 Other microscopic hematuria: Secondary | ICD-10-CM | POA: Insufficient documentation

## 2012-07-01 DIAGNOSIS — R51 Headache: Secondary | ICD-10-CM | POA: Insufficient documentation

## 2012-07-01 DIAGNOSIS — F17200 Nicotine dependence, unspecified, uncomplicated: Secondary | ICD-10-CM | POA: Insufficient documentation

## 2012-07-01 DIAGNOSIS — Z90710 Acquired absence of both cervix and uterus: Secondary | ICD-10-CM | POA: Insufficient documentation

## 2012-07-01 LAB — CBC WITH DIFFERENTIAL/PLATELET
Basophils Relative: 0 % (ref 0–1)
Eosinophils Absolute: 0.1 10*3/uL (ref 0.0–0.7)
Eosinophils Relative: 1 % (ref 0–5)
MCH: 26.9 pg (ref 26.0–34.0)
MCHC: 33.7 g/dL (ref 30.0–36.0)
MCV: 79.9 fL (ref 78.0–100.0)
Neutrophils Relative %: 55 % (ref 43–77)
Platelets: 388 10*3/uL (ref 150–400)
RDW: 14.1 % (ref 11.5–15.5)

## 2012-07-01 LAB — BASIC METABOLIC PANEL
Calcium: 9.5 mg/dL (ref 8.4–10.5)
GFR calc Af Amer: >90 mL/min (ref >90)
GFR calc non Af Amer: >90 mL/min (ref >90)
Potassium: 3.9 mEq/L (ref 3.5–5.1)
Sodium: 139 mEq/L (ref 135–145)

## 2012-07-01 LAB — URINE MICROSCOPIC-ADD ON

## 2012-07-01 LAB — URINALYSIS, ROUTINE W REFLEX MICROSCOPIC
Bilirubin Urine: NEGATIVE
Nitrite: NEGATIVE
Protein, ur: NEGATIVE mg/dL
Specific Gravity, Urine: 1.019 (ref 1.005–1.030)
Urobilinogen, UA: 0.2 mg/dL (ref 0.0–1.0)

## 2012-07-01 MED ORDER — HYDROMORPHONE HCL PF 2 MG/ML IJ SOLN
2.0000 mg | Freq: Once | INTRAMUSCULAR | Status: AC
  Administered 2012-07-01: 2 mg via INTRAMUSCULAR
  Filled 2012-07-01: qty 1

## 2012-07-01 MED ORDER — DIPHENHYDRAMINE HCL 25 MG PO TABS: 25.0000 mg | ORAL_TABLET | Freq: Four times a day (QID) | ORAL | Status: DC | PRN

## 2012-07-01 MED ORDER — OXYCODONE-ACETAMINOPHEN 5-325 MG PO TABS: 1.0000 | ORAL_TABLET | Freq: Four times a day (QID) | ORAL | Status: AC | PRN

## 2012-07-01 NOTE — ED Provider Notes (Signed)
History     CSN: [id number]  Arrival date & time 07/01/12  1606   First MD Initiated Contact with Patient 07/01/12 1635      Chief Complaint  Patient presents with  . Headache    (Consider location/radiation/quality/duration/timing/severity/associated sxs/prior treatment) Patient is a 36 y.o. female presenting with headaches. The history is provided by the patient.  Headache  The current episode started more than 2 days ago (Ongoing for 3 days.). The problem occurs constantly. The problem has not changed since onset.The headache is associated with bright light. The pain is located in the frontal (Above the right eye.) region. The quality of the pain is described as sharp. The pain is at a severity of 9/10. The pain is moderate. The pain does not radiate. Pertinent negatives include no fever, no palpitations, no syncope, no shortness of breath, no nausea and no vomiting.   patient with the additional complaints as well as tingling in all 4 extremities has weakness has been having trouble sleeping for months has joint pains. No fever no nausea vomiting no history of migraines. Patient did take a sinus medicine and maybe a sinus infection that did not help she did take 100 mg Motrin has not helped. Patient in January had a hysterectomy which also included removal of her ovaries.  Past Medical History  Diagnosis Date  . Fibroid, uterus   . Chronic pelvic pain in female   . Anemia   . Blood transfusion 2000  . Active smoker   . Endometriosis   . Headache     Past Surgical History  Procedure Date  . Hysteroscopy w/d&c 10-26-10    polypectomy  . Ganglion cyst excision age 81    right wrist  . Laparoscopy 10/01/2011    Procedure: LAPAROSCOPY DIAGNOSTIC;  Surgeon: Meriel Pica;  Location: [facility];  Service: Gynecology;  Laterality: N/A;  . Abdominal hysterectomy 12/11/2011    Procedure: HYSTERECTOMY ABDOMINAL;  Surgeon: Meriel Pica, MD;  Location: [facility];   Service: Gynecology;  Laterality: N/A;  . Salpingoophorectomy 12/11/2011    Procedure: SALPINGO OOPHERECTOMY;  Surgeon: Meriel Pica, MD;  Location: [facility];  Service: Gynecology;  Laterality: Bilateral;    Family History  Problem Relation Age of Onset  . Diabetes Maternal Grandmother   . Hypertension Maternal Grandmother   . Heart disease Maternal Grandmother     History  Substance Use Topics  . Smoking status: Current Everyday Smoker -- 0.5 packs/day for 6 years    Types: Cigarettes  . Smokeless tobacco: Never Used  . Alcohol Use: No    OB History    Grav Para Term Preterm Abortions TAB SAB Ect Mult Living   0               Review of Systems  Constitutional: Negative for fever.  HENT: Positive for congestion and sinus pressure. Negative for neck pain.   Eyes: Positive for photophobia. Negative for visual disturbance.  Respiratory: Negative for shortness of breath.   Cardiovascular: Negative for palpitations and syncope.  Gastrointestinal: Negative for nausea and vomiting.  Genitourinary: Positive for dysuria.  Musculoskeletal: Negative for back pain.  Skin: Negative for rash.  Neurological: Positive for weakness, numbness and headaches. Negative for dizziness, facial asymmetry and speech difficulty.  Hematological: Does not bruise/bleed easily.    Allergies  Percocet  Home Medications   Current Outpatient Rx  Name Route Sig Dispense Refill  . DIPHENHYDRAMINE HCL (SLEEP) 25 MG PO CAPS Oral Take  2 capsules by mouth daily as needed. For sleep.    . IBUPROFEN 800 MG PO TABS Oral Take 800 mg by mouth every 8 (eight) hours as needed. pain    . DIPHENHYDRAMINE HCL 25 MG PO TABS Oral Take 1 tablet (25 mg total) by mouth every 6 (six) hours as needed for itching. 20 tablet 0  . OXYCODONE-ACETAMINOPHEN 5-325 MG PO TABS Oral Take 1 tablet by mouth every 6 (six) hours as needed for pain. 14 tablet 0    BP 143/83  Pulse 108  Temp 97.8 F (36.6 C) (Oral)  Resp 16  Ht  5\' 7"  (1.702 m)  Wt 220 lb (99.791 kg)  BMI 34.46 kg/m2  SpO2 100%  LMP 12/14/2010  Physical Exam  Nursing note and vitals reviewed. Constitutional: She is oriented to person, place, and time. She appears well-developed and well-nourished. No distress.  HENT:  Head: Normocephalic and atraumatic.  Mouth/Throat: Oropharynx is clear and moist.       No tenderness to palpation over sinuses.  Eyes: Conjunctivae and EOM are normal. Pupils are equal, round, and reactive to light.  Neck: Normal range of motion. Neck supple.  Cardiovascular: Normal rate, regular rhythm and normal heart sounds.   Pulmonary/Chest: Effort normal and breath sounds normal. No respiratory distress.  Abdominal: Soft. Bowel sounds are normal. There is tenderness.  Musculoskeletal: Normal range of motion. She exhibits no tenderness.  Neurological: She is alert and oriented to person, place, and time. No cranial nerve deficit. She exhibits normal muscle tone. Coordination normal.  Skin: Skin is warm. No rash noted.    ED Course  Procedures (including critical care time)  Labs Reviewed  URINALYSIS, ROUTINE W REFLEX MICROSCOPIC - Abnormal; Notable for the following:    Hgb urine dipstick SMALL (*)     All other components within normal limits  CBC WITH DIFFERENTIAL  BASIC METABOLIC PANEL  URINE MICROSCOPIC-ADD ON   Ct Head Wo Contrast  07/01/2012  *RADIOLOGY REPORT*  Clinical Data: 36 year old female with right frontal headache. Dizziness.  CT HEAD WITHOUT CONTRAST  Technique:  Contiguous axial images were obtained from the base of the skull through the vertex without contrast.  Comparison: None.  Findings: Visualized paranasal sinuses and mastoids are clear.  No acute osseous abnormality identified.  Small right of midline forehead scalp lipoma (series 3 image 13). Otherwise normal orbits and scalp soft tissues.  Occasional dural calcifications.  Normal cerebral volume.  No ventriculomegaly. No midline shift, mass  effect, or evidence of mass lesion.  No evidence of cortically based acute infarction identified.  No suspicious intracranial vascular hyperdensity. No acute intracranial hemorrhage identified.  IMPRESSION: Normal noncontrast CT appearance of the brain.  Original Report Authenticated By: Harley Hallmark, M.D.   Results for orders placed during the hospital encounter of 07/01/12  CBC WITH DIFFERENTIAL      Component Value Range   WBC 7.4  4.0 - 10.5 K/uL   RBC 4.57  3.87 - 5.11 MIL/uL   Hemoglobin 12.3  12.0 - 15.0 g/dL   HCT 96.2  95.2 - 84.1 %   MCV 79.9  78.0 - 100.0 fL   MCH 26.9  26.0 - 34.0 pg   MCHC 33.7  30.0 - 36.0 g/dL   RDW 32.4  40.1 - 02.7 %   Platelets 388  150 - 400 K/uL   Neutrophils Relative 55  43 - 77 %   Neutro Abs 4.0  1.7 - 7.7 K/uL   Lymphocytes Relative  35  12 - 46 %   Lymphs Abs 2.6  0.7 - 4.0 K/uL   Monocytes Relative 9  3 - 12 %   Monocytes Absolute 0.7  0.1 - 1.0 K/uL   Eosinophils Relative 1  0 - 5 %   Eosinophils Absolute 0.1  0.0 - 0.7 K/uL   Basophils Relative 0  0 - 1 %   Basophils Absolute 0.0  0.0 - 0.1 K/uL  BASIC METABOLIC PANEL      Component Value Range   Sodium 139  135 - 145 mEq/L   Potassium 3.9  3.5 - 5.1 mEq/L   Chloride 103  96 - 112 mEq/L   CO2 26  19 - 32 mEq/L   Glucose, Bld 91  70 - 99 mg/dL   BUN 12  6 - 23 mg/dL   Creatinine, Ser 9.14  0.50 - 1.10 mg/dL   Calcium 9.5  8.4 - 78.2 mg/dL   GFR calc non Af Amer >90  >90 mL/min   GFR calc Af Amer >90  >90 mL/min  URINALYSIS, ROUTINE W REFLEX MICROSCOPIC      Component Value Range   Color, Urine YELLOW  YELLOW   APPearance CLEAR  CLEAR   Specific Gravity, Urine 1.019  1.005 - 1.030   pH 6.0  5.0 - 8.0   Glucose, UA NEGATIVE  NEGATIVE mg/dL   Hgb urine dipstick SMALL (*) NEGATIVE   Bilirubin Urine NEGATIVE  NEGATIVE   Ketones, ur NEGATIVE  NEGATIVE mg/dL   Protein, ur NEGATIVE  NEGATIVE mg/dL   Urobilinogen, UA 0.2  0.0 - 1.0 mg/dL   Nitrite NEGATIVE  NEGATIVE   Leukocytes, UA  NEGATIVE  NEGATIVE  URINE MICROSCOPIC-ADD ON      Component Value Range   Squamous Epithelial / LPF RARE  RARE   RBC / HPF 3-6  <3 RBC/hpf   Bacteria, UA RARE  RARE     1. Headache       MDM  Workup in the emergency department no evidence of intracranial pathology based on head CT no evidence of sinus infection. Also patient's blood work shows no evidence of anemia or electrolyte disturbance. Urinalysis has some microscopic hematuria which patient will followup with have rechecked. Recommend patient followup with her GYN Dr. for consideration of possible hormone supplementation for the symptoms of not being able to sleep and of hot flashes. It is possible tomorrow symptoms may be related to heart normal imbalance.        Shelda Jakes, MD 07/01/12 [phone]

## 2012-07-01 NOTE — ED Notes (Signed)
Acuity changed from 4 to 3 based on new complaints of pt and plan of care.

## 2012-07-01 NOTE — ED Notes (Signed)
HA over right eye x3 days with photosensitivity.. Denies nausea. No hx of migraines.

## 2013-05-11 ENCOUNTER — Emergency Department (HOSPITAL_BASED_OUTPATIENT_CLINIC_OR_DEPARTMENT_OTHER)
Admission: EM | Admit: 2013-05-11 | Discharge: 2013-05-11 | Disposition: A | Discharge status: Home / Self Care | Payer: Managed Care, Other (non HMO) | Attending: Emergency Medicine | Admitting: Emergency Medicine

## 2013-05-11 ENCOUNTER — Encounter (HOSPITAL_BASED_OUTPATIENT_CLINIC_OR_DEPARTMENT_OTHER): Payer: Self-pay | Admitting: *Deleted

## 2013-05-11 DIAGNOSIS — Z8669 Personal history of other diseases of the nervous system and sense organs: Secondary | ICD-10-CM | POA: Insufficient documentation

## 2013-05-11 DIAGNOSIS — Z79899 Other long term (current) drug therapy: Secondary | ICD-10-CM | POA: Insufficient documentation

## 2013-05-11 DIAGNOSIS — G629 Polyneuropathy, unspecified: Secondary | ICD-10-CM

## 2013-05-11 DIAGNOSIS — R0602 Shortness of breath: Secondary | ICD-10-CM | POA: Insufficient documentation

## 2013-05-11 DIAGNOSIS — R002 Palpitations: Secondary | ICD-10-CM

## 2013-05-11 DIAGNOSIS — G8929 Other chronic pain: Secondary | ICD-10-CM | POA: Insufficient documentation

## 2013-05-11 DIAGNOSIS — Z87891 Personal history of nicotine dependence: Secondary | ICD-10-CM | POA: Insufficient documentation

## 2013-05-11 DIAGNOSIS — Z8742 Personal history of other diseases of the female genital tract: Secondary | ICD-10-CM | POA: Insufficient documentation

## 2013-05-11 DIAGNOSIS — G5690 Unspecified mononeuropathy of unspecified upper limb: Secondary | ICD-10-CM | POA: Insufficient documentation

## 2013-05-11 DIAGNOSIS — Z3202 Encounter for pregnancy test, result negative: Secondary | ICD-10-CM | POA: Insufficient documentation

## 2013-05-11 DIAGNOSIS — R209 Unspecified disturbances of skin sensation: Secondary | ICD-10-CM | POA: Insufficient documentation

## 2013-05-11 HISTORY — DX: Encounter for other specified aftercare: Z51.89

## 2013-05-11 LAB — COMPREHENSIVE METABOLIC PANEL
Albumin: 3.5 g/dL (ref 3.5–5.2)
Alkaline Phosphatase: 94 U/L (ref 39–117)
BUN: 15 mg/dL (ref 6–23)
Calcium: 9.7 mg/dL (ref 8.4–10.5)
Creatinine, Ser: 0.8 mg/dL (ref 0.50–1.10)
GFR calc Af Amer: >90 mL/min (ref >90)
Glucose, Bld: 86 mg/dL (ref 70–99)
Potassium: 3.9 mEq/L (ref 3.5–5.1)
Total Protein: 7.2 g/dL (ref 6.0–8.3)

## 2013-05-11 LAB — URINALYSIS, ROUTINE W REFLEX MICROSCOPIC
Glucose, UA: NEGATIVE mg/dL
Ketones, ur: NEGATIVE mg/dL
Leukocytes, UA: NEGATIVE
Nitrite: NEGATIVE
Specific Gravity, Urine: 1.024 (ref 1.005–1.030)
pH: 6 (ref 5.0–8.0)

## 2013-05-11 LAB — CBC WITH DIFFERENTIAL/PLATELET
Basophils Relative: 0 % (ref 0–1)
Eosinophils Absolute: 0.1 10*3/uL (ref 0.0–0.7)
Eosinophils Relative: 1 % (ref 0–5)
Hemoglobin: 11.7 g/dL — ABNORMAL LOW (ref 12.0–15.0)
Lymphs Abs: 2.7 10*3/uL (ref 0.7–4.0)
MCH: 28.3 pg (ref 26.0–34.0)
MCHC: 33.5 g/dL (ref 30.0–36.0)
MCV: 84.3 fL (ref 78.0–100.0)
Monocytes Absolute: 0.7 10*3/uL (ref 0.1–1.0)
Monocytes Relative: 9 % (ref 3–12)
Neutrophils Relative %: 54 % (ref 43–77)
RBC: 4.14 MIL/uL (ref 3.87–5.11)

## 2013-05-11 LAB — URINE MICROSCOPIC-ADD ON

## 2013-05-11 LAB — PREGNANCY, URINE: Preg Test, Ur: NEGATIVE

## 2013-05-11 MED ORDER — IBUPROFEN 600 MG PO TABS: 600.0000 mg | ORAL_TABLET | Freq: Three times a day (TID) | ORAL | Status: DC

## 2013-05-11 NOTE — ED Notes (Signed)
Pt reports feeling "heart flutter" since yesterday afternoon around 2 pm- reports more frequent today and reports left fingers "feel numb"

## 2013-05-11 NOTE — ED Provider Notes (Signed)
History    CSN: [id number] Arrival date & time 05/11/13  5621  First MD Initiated Contact with Patient 05/11/13 1908     Chief Complaint  Patient presents with  . Palpitations   (Consider location/radiation/quality/duration/timing/severity/associated sxs/prior Treatment) HPI Pt presents with 2 episodes of heart "fluttering" lasting several min and starting yesterday. Currently no fluttering. Occurs at work. Denies higher levels of stress or anxiety. No CP. No N/V.  Pt also c/o R hand pin and needles, worse at night and not coordinated with fluttering. Pt states she has pins and needle sensation now in palmar surface of 2-5th digits. No weakness.  Past Medical History  Diagnosis Date  . Fibroid, uterus   . Chronic pelvic pain in female   . Anemia   . Blood transfusion 2000  . Active smoker   . Endometriosis   . Headache(784.0)   . Blood transfusion without reported diagnosis    Past Surgical History  Procedure Laterality Date  . Hysteroscopy w/d&c  10-26-10    polypectomy  . Ganglion cyst excision  age 6    right wrist  . Laparoscopy  10/01/2011    Procedure: LAPAROSCOPY DIAGNOSTIC;  Surgeon: Meriel Pica;  Location: [facility];  Service: Gynecology;  Laterality: N/A;  . Abdominal hysterectomy  12/11/2011    Procedure: HYSTERECTOMY ABDOMINAL;  Surgeon: Meriel Pica, MD;  Location: [facility];  Service: Gynecology;  Laterality: N/A;  . Salpingoophorectomy  12/11/2011    Procedure: SALPINGO OOPHERECTOMY;  Surgeon: Meriel Pica, MD;  Location: [facility];  Service: Gynecology;  Laterality: Bilateral;   Family History  Problem Relation Age of Onset  . Diabetes Maternal Grandmother   . Hypertension Maternal Grandmother   . Heart disease Maternal Grandmother    History  Substance Use Topics  . Smoking status: Former Smoker -- 0.50 packs/day for 6 years    Types: Cigarettes    Quit date: 03/04/2013  . Smokeless tobacco: Never Used  . Alcohol Use: No    OB History   Grav Para Term Preterm Abortions TAB SAB Ect Mult Living   0              Review of Systems  Constitutional: Negative for fever and chills.  HENT: Negative for neck pain.   Respiratory: Positive for shortness of breath. Negative for cough and wheezing.   Cardiovascular: Positive for palpitations. Negative for chest pain and leg swelling.  Gastrointestinal: Negative for nausea, vomiting, abdominal pain and diarrhea.  Musculoskeletal: Negative for myalgias and back pain.  Skin: Negative for rash and wound.  Neurological: Positive for numbness. Negative for dizziness, syncope, weakness, light-headedness and headaches.  All other systems reviewed and are negative.    Allergies  Percocet  Home Medications   Current Outpatient Rx  Name  Route  Sig  Dispense  Refill  . DiphenhydrAMINE HCl, Sleep, (ZZZQUIL) 25 MG CAPS   Oral   Take 2 capsules by mouth daily as needed. For sleep.         Marland Kitchen ibuprofen (ADVIL,MOTRIN) 800 MG tablet   Oral   Take 800 mg by mouth every 8 (eight) hours as needed. pain         . EXPIRED: diphenhydrAMINE (BENADRYL) 25 MG tablet   Oral   Take 1 tablet (25 mg total) by mouth every 6 (six) hours as needed for itching.   20 tablet   0   . ibuprofen (ADVIL,MOTRIN) 600 MG tablet   Oral   Take  1 tablet (600 mg total) by mouth 3 (three) times daily.   30 tablet   0    BP 132/83  Pulse 84  Temp(Src) 98.5 F (36.9 C) (Oral)  Resp 20  Ht 5\' 7"  (1.702 m)  Wt 220 lb (99.791 kg)  BMI 34.45 kg/m2  SpO2 100%  LMP 12/14/2010 Physical Exam  Nursing note and vitals reviewed. Constitutional: She is oriented to person, place, and time. She appears well-developed and well-nourished. No distress.  HENT:  Head: Normocephalic and atraumatic.  Mouth/Throat: Oropharynx is clear and moist.  Eyes: EOM are normal. Pupils are equal, round, and reactive to light.  Neck: Normal range of motion. Neck supple. No JVD present. No tracheal deviation  present. No thyromegaly present.  Cardiovascular: Normal rate and regular rhythm.  Exam reveals no gallop and no friction rub.   No murmur heard. Pulmonary/Chest: Effort normal and breath sounds normal. No respiratory distress. She has no wheezes. She has no rales. She exhibits no tenderness.  Abdominal: Soft. Bowel sounds are normal. She exhibits no distension and no mass. There is no tenderness. There is no rebound and no guarding.  Musculoskeletal: Normal range of motion. She exhibits no edema and no tenderness.  positive Tinel and Phalen sign in R hand. Good cap refill.   Lymphadenopathy:    She has no cervical adenopathy.  Neurological: She is alert and oriented to person, place, and time.  Tingling on palmar surface or R hand. Normal strength. 5/5 motor throughout.   Skin: Skin is warm and dry. No rash noted. No erythema.  Psychiatric: She has a normal mood and affect. Her behavior is normal.    ED Course  Procedures (including critical care time) Labs Reviewed  CBC WITH DIFFERENTIAL - Abnormal; Notable for the following:    Hemoglobin 11.7 (*)    HCT 34.9 (*)    All other components within normal limits  COMPREHENSIVE METABOLIC PANEL - Abnormal; Notable for the following:    Total Bilirubin 0.2 (*)    All other components within normal limits  URINALYSIS, ROUTINE W REFLEX MICROSCOPIC - Abnormal; Notable for the following:    Hgb urine dipstick SMALL (*)    All other components within normal limits  URINE MICROSCOPIC-ADD ON - Abnormal; Notable for the following:    Bacteria, UA MANY (*)    All other components within normal limits  PREGNANCY, URINE   No results found. 1. Palpitations   2. Peripheral neuropathy     Date: 05/11/2013  Rate: 79  Rhythm: normal sinus rhythm  QRS Axis: normal  Intervals: normal  ST/T Wave abnormalities: nonspecific T wave changes  Conduction Disutrbances:none  Narrative Interpretation:   Old EKG Reviewed: none available   MDM  Suspect  carpal tunnel syndrome. No fluttering episodes in ED. Advised to f/u with cardiology  Loren Racer, MD 05/11/13 2348

## 2013-05-11 NOTE — ED Notes (Signed)
Patient changed into gown, put on cardiac monitor.

## 2013-05-11 NOTE — ED Notes (Signed)
Pt notified of need for urine sample when able. 

## 2014-03-03 ENCOUNTER — Emergency Department (HOSPITAL_BASED_OUTPATIENT_CLINIC_OR_DEPARTMENT_OTHER): Payer: No Typology Code available for payment source

## 2014-03-03 ENCOUNTER — Encounter (HOSPITAL_BASED_OUTPATIENT_CLINIC_OR_DEPARTMENT_OTHER): Payer: Self-pay | Admitting: Emergency Medicine

## 2014-03-03 ENCOUNTER — Emergency Department (HOSPITAL_BASED_OUTPATIENT_CLINIC_OR_DEPARTMENT_OTHER)
Admission: EM | Admit: 2014-03-03 | Discharge: 2014-03-03 | Disposition: A | Discharge status: Home / Self Care | Payer: No Typology Code available for payment source | Attending: Emergency Medicine | Admitting: Emergency Medicine

## 2014-03-03 DIAGNOSIS — Z862 Personal history of diseases of the blood and blood-forming organs and certain disorders involving the immune mechanism: Secondary | ICD-10-CM | POA: Insufficient documentation

## 2014-03-03 DIAGNOSIS — Z87891 Personal history of nicotine dependence: Secondary | ICD-10-CM | POA: Insufficient documentation

## 2014-03-03 DIAGNOSIS — G8929 Other chronic pain: Secondary | ICD-10-CM | POA: Insufficient documentation

## 2014-03-03 DIAGNOSIS — J069 Acute upper respiratory infection, unspecified: Secondary | ICD-10-CM | POA: Insufficient documentation

## 2014-03-03 DIAGNOSIS — Z8742 Personal history of other diseases of the female genital tract: Secondary | ICD-10-CM | POA: Insufficient documentation

## 2014-03-03 MED ORDER — DEXAMETHASONE 4 MG PO TABS
10.0000 mg | ORAL_TABLET | Freq: Once | ORAL | Status: AC
  Administered 2014-03-03: 10 mg via ORAL

## 2014-03-03 MED ORDER — IBUPROFEN 800 MG PO TABS
800.0000 mg | ORAL_TABLET | Freq: Once | ORAL | Status: AC
  Administered 2014-03-03: 800 mg via ORAL
  Filled 2014-03-03: qty 1

## 2014-03-03 MED ORDER — DEXAMETHASONE 4 MG PO TABS
ORAL_TABLET | ORAL | Status: AC
  Filled 2014-03-03: qty 3

## 2014-03-03 NOTE — ED Provider Notes (Signed)
CSN: [id number]     Arrival date & time 03/03/14  2026 History  This chart was scribed for Osvaldo Shipper, MD by Eston Mould, ED Scribe. This patient was seen in room MH08/MH08 and the patient's care was started at 8:51 PM.   Chief Complaint  Patient presents with  . URI   Patient is a 38 y.o. female presenting with URI. The history is provided by the patient. No language interpreter was used.  URI Presenting symptoms: congestion, cough, fever (subjective) and rhinorrhea   Presenting symptoms: no ear pain   Congestion:    Location:  Nasal Cough:    Cough characteristics:  Productive   Sputum characteristics:  Nondescript and brown   Severity:  Mild   Onset quality:  Sudden   Timing:  Constant   Progression:  Unchanged   Chronicity:  New Fever:    Temp source:  Subjective Severity:  Mild Timing:  Constant Progression:  Unchanged Chronicity:  New Relieved by:  Nothing Worsened by:  Nothing tried Associated symptoms: swollen glands   Associated symptoms: no neck pain, no sinus pain, no sneezing and no wheezing   Risk factors: no sick contacts   Risk factors comment:  Smoking  HPI Comments: CLAUDETTE WERMUTH is a 38 y.o. female who presents to the Emergency Department complaining of ongoing nausea, subjective fever, chills, rhinorrhea, abd and back pain, and productive cough with sputum sx that began 2 days ago. She reports having a reduced appetite and states she ate half a sandwich PTA. Reports being a smoker and denies having a cough normally. Pt was seen by PCP; reports having swollen neck lymph nodes and states she was discharge with Amoxicillin. She denies taking any OTC medications PTA. Pt denies any recent sick contacts. She denies emesis and diarrhea.  Past Medical History  Diagnosis Date  . Fibroid, uterus   . Chronic pelvic pain in female   . Anemia   . Blood transfusion 2000  . Active smoker   . Endometriosis   . Headache(784.0)   . Blood  transfusion without reported diagnosis    Past Surgical History  Procedure Laterality Date  . Hysteroscopy w/d&c  10-26-10    polypectomy  . Ganglion cyst excision  age 86    right wrist  . Laparoscopy  10/01/2011    Procedure: LAPAROSCOPY DIAGNOSTIC;  Surgeon: Margarette Asal;  Location: [facility];  Service: Gynecology;  Laterality: N/A;  . Abdominal hysterectomy  12/11/2011    Procedure: HYSTERECTOMY ABDOMINAL;  Surgeon: Margarette Asal, MD;  Location: [facility];  Service: Gynecology;  Laterality: N/A;  . Salpingoophorectomy  12/11/2011    Procedure: SALPINGO OOPHERECTOMY;  Surgeon: Margarette Asal, MD;  Location: [facility];  Service: Gynecology;  Laterality: Bilateral;   Family History  Problem Relation Age of Onset  . Diabetes Maternal Grandmother   . Hypertension Maternal Grandmother   . Heart disease Maternal Grandmother    History  Substance Use Topics  . Smoking status: Former Smoker -- 0.50 packs/day for 6 years    Types: Cigarettes    Quit date: 03/04/2013  . Smokeless tobacco: Never Used  . Alcohol Use: No   OB History   Grav Para Term Preterm Abortions TAB SAB Ect Mult Living   0              Review of Systems  Constitutional: Positive for fever (subjective).  HENT: Positive for congestion and rhinorrhea. Negative for ear pain and sneezing.  Respiratory: Positive for cough. Negative for wheezing.   Musculoskeletal: Negative for neck pain.  All other systems reviewed and are negative.  Allergies  Percocet  Home Medications   Prior to Admission medications   Medication Sig Start Date End Date Taking? Authorizing Provider  diphenhydrAMINE (BENADRYL) 25 MG tablet Take 1 tablet (25 mg total) by mouth every 6 (six) hours as needed for itching. 07/01/12 07/31/12  Mervin Kung, MD  DiphenhydrAMINE HCl, Sleep, (ZZZQUIL) 25 MG CAPS Take 2 capsules by mouth daily as needed. For sleep.    Historical Provider, MD  ibuprofen (ADVIL,MOTRIN) 600 MG  tablet Take 1 tablet (600 mg total) by mouth 3 (three) times daily. 05/11/13   Julianne Rice, MD  ibuprofen (ADVIL,MOTRIN) 800 MG tablet Take 800 mg by mouth every 8 (eight) hours as needed. pain    Historical Provider, MD   Triage Vitals:BP 98/87  Pulse 125  Temp(Src) 100.7 F (38.2 C) (Oral)  Resp 16  Ht 5\' 7"  (1.702 m)  Wt 220 lb (99.791 kg)  BMI 34.45 kg/m2  SpO2 100%  LMP 12/14/2010  Physical Exam  Nursing note and vitals reviewed. Constitutional: She is oriented to person, place, and time. She appears well-developed and well-nourished. No distress.  HENT:  Head: Normocephalic and atraumatic.  Right Ear: External ear normal.  Left Ear: External ear normal.  Mouth/Throat: Oropharynx is clear and moist.  Mild erythema of the throat. No tonsillar swelling.  Eyes: EOM are normal.  Neck: Neck supple. No tracheal deviation present.  Cardiovascular: Normal rate, regular rhythm and normal heart sounds.  Exam reveals no gallop and no friction rub.   No murmur heard. Pulmonary/Chest: Effort normal and breath sounds normal. No respiratory distress. She has no wheezes. She has no rales. She exhibits no tenderness.  Musculoskeletal: Normal range of motion.  Neurological: She is alert and oriented to person, place, and time.  Skin: Skin is warm and dry.  Psychiatric: She has a normal mood and affect. Her behavior is normal.   ED Course  Procedures  DIAGNOSTIC STUDIES: Oxygen Saturation is 100% on RA, normal by my interpretation.    COORDINATION OF CARE: 8:54 PM-Discussed treatment plan which includes CXR. Advised pt to continue taking Amoxicillin as prescribed by PCP. Pt agreed to plan.   Labs Review Labs Reviewed - No data to display  Imaging Review Dg Chest 2 View  03/03/2014   CLINICAL DATA:  Cough, nausea and chills.  EXAM: CHEST - 2 VIEW  COMPARISON:  DG CHEST 2 VIEW dated 08/17/2011  FINDINGS: The heart size and mediastinal contours are within normal limits. There is no  evidence of pulmonary edema, consolidation, pneumothorax, nodule or pleural fluid. The visualized skeletal structures are unremarkable.  IMPRESSION: No active disease.   Electronically Signed   By: Aletta Edouard M.D.   On: 03/03/2014 21:13     EKG Interpretation None     MDM   Final diagnoses:  URI (upper respiratory infection)    8F here with URI symptoms. Febrile here. Throat with mild swelling. Mild L submandibular lymphadenopathy. On amoxicillin, prescribed by Urgent Care for her lymphadenopathy. Still feeling bad, cough. Airway patent, no concern for PTA, RPA. CXR negative. Feeling better after tylenol. Given decadron. Stable for discharge.  I personally performed the services described in this documentation, which was scribed in my presence. The recorded information has been reviewed and is accurate.     Osvaldo Shipper, MD 03/03/14 2300

## 2014-03-03 NOTE — Discharge Instructions (Signed)
Upper Respiratory Infection, Adult An upper respiratory infection (URI) is also sometimes known as the common cold. The upper respiratory tract includes the nose, sinuses, throat, trachea, and bronchi. Bronchi are the airways leading to the lungs. Most people improve within 1 week, but symptoms can last up to 2 weeks. A residual cough may last even longer.  CAUSES Many different viruses can infect the tissues lining the upper respiratory tract. The tissues become irritated and inflamed and often become very moist. Mucus production is also common. A cold is contagious. You can easily spread the virus to others by oral contact. This includes kissing, sharing a glass, coughing, or sneezing. Touching your mouth or nose and then touching a surface, which is then touched by another person, can also spread the virus. SYMPTOMS  Symptoms typically develop 1 to 3 days after you come in contact with a cold virus. Symptoms vary from person to person. They may include:  Runny nose.  Sneezing.  Nasal congestion.  Sinus irritation.  Sore throat.  Loss of voice (laryngitis).  Cough.  Fatigue.  Muscle aches.  Loss of appetite.  Headache.  Low-grade fever. DIAGNOSIS  You might diagnose your own cold based on familiar symptoms, since most people get a cold 2 to 3 times a year. Your caregiver can confirm this based on your exam. Most importantly, your caregiver can check that your symptoms are not due to another disease such as strep throat, sinusitis, pneumonia, asthma, or epiglottitis. Blood tests, throat tests, and X-rays are not necessary to diagnose a common cold, but they may sometimes be helpful in excluding other more serious diseases. Your caregiver will decide if any further tests are required. RISKS AND COMPLICATIONS  You may be at risk for a more severe case of the common cold if you smoke cigarettes, have chronic heart disease (such as heart failure) or lung disease (such as asthma), or if  you have a weakened immune system. The very young and very old are also at risk for more serious infections. Bacterial sinusitis, middle ear infections, and bacterial pneumonia can complicate the common cold. The common cold can worsen asthma and chronic obstructive pulmonary disease (COPD). Sometimes, these complications can require emergency medical care and may be life-threatening. PREVENTION  The best way to protect against getting a cold is to practice good hygiene. Avoid oral or hand contact with people with cold symptoms. Wash your hands often if contact occurs. There is no clear evidence that vitamin C, vitamin E, echinacea, or exercise reduces the chance of developing a cold. However, it is always recommended to get plenty of rest and practice good nutrition. TREATMENT  Treatment is directed at relieving symptoms. There is no cure. Antibiotics are not effective, because the infection is caused by a virus, not by bacteria. Treatment may include:  Increased fluid intake. Sports drinks offer valuable electrolytes, sugars, and fluids.  Breathing heated mist or steam (vaporizer or shower).  Eating chicken soup or other clear broths, and maintaining good nutrition.  Getting plenty of rest.  Using gargles or lozenges for comfort.  Controlling fevers with ibuprofen or acetaminophen as directed by your caregiver.  Increasing usage of your inhaler if you have asthma. Zinc gel and zinc lozenges, taken in the first 24 hours of the common cold, can shorten the duration and lessen the severity of symptoms. Pain medicines may help with fever, muscle aches, and throat pain. A variety of non-prescription medicines are available to treat congestion and runny nose. Your caregiver  can make recommendations and may suggest nasal or lung inhalers for other symptoms.  °HOME CARE INSTRUCTIONS  °· Only take over-the-counter or prescription medicines for pain, discomfort, or fever as directed by your  caregiver. °· Use a warm mist humidifier or inhale steam from a shower to increase air moisture. This may keep secretions moist and make it easier to breathe. °· Drink enough water and fluids to keep your urine clear or pale yellow. °· Rest as needed. °· Return to work when your temperature has returned to normal or as your caregiver advises. You may need to stay home longer to avoid infecting others. You can also use a face mask and careful hand washing to prevent spread of the virus. °SEEK MEDICAL CARE IF:  °· After the first few days, you feel you are getting worse rather than better. °· You need your caregiver's advice about medicines to control symptoms. °· You develop chills, worsening shortness of breath, or brown or red sputum. These may be signs of pneumonia. °· You develop yellow or brown nasal discharge or pain in the face, especially when you bend forward. These may be signs of sinusitis. °· You develop a fever, swollen neck glands, pain with swallowing, or white areas in the back of your throat. These may be signs of strep throat. °SEEK IMMEDIATE MEDICAL CARE IF:  °· You have a fever. °· You develop severe or persistent headache, ear pain, sinus pain, or chest pain. °· You develop wheezing, a prolonged cough, cough up blood, or have a change in your usual mucus (if you have chronic lung disease). °· You develop sore muscles or a stiff neck. °Document Released: 04/30/2001 Document Revised: 01/27/2012 Document Reviewed: 03/08/2011 °ExitCare® Patient Information ©2014 [employer]. ° ° °Emergency Department Resource Guide °1) Find a Doctor and Pay Out of Pocket °Although you won't have to find out who is covered by your insurance plan, it is a good idea to ask around and get recommendations. You will then need to call the office and see if the doctor you have chosen will accept you as a new patient and what types of options they offer for patients who are self-pay. Some doctors offer discounts or will set  up payment plans for their patients who do not have insurance, but you will need to ask so you aren't surprised when you get to your appointment. ° °2) Contact Your Local Health Department °Not all health departments have doctors that can see patients for sick visits, but many do, so it is worth a call to see if yours does. If you don't know where your local health department is, you can check in your phone book. The CDC also has a tool to help you locate your state's health department, and many state websites also have listings of all of their local health departments. ° °3) Find a Walk-in Clinic °If your illness is not likely to be very severe or complicated, you may want to try a walk in clinic. These are popping up all over the country in pharmacies, drugstores, and shopping centers. They're usually staffed by nurse practitioners or physician assistants that have been trained to treat common illnesses and complaints. They're usually fairly quick and inexpensive. However, if you have serious medical issues or chronic medical problems, these are probably not your best option. ° °No Primary Care Doctor: °- Call Health Connect at  [phone] - they can help you locate a primary care doctor that  accepts your insurance, provides   certain services, etc. - Physician Referral Service- [phone]  Chronic Pain Problems: Organization         Address  Phone   Notes  [facility]  [phone] Patients need to be referred by their primary care doctor.   Medication Assistance: Organization         Address  Phone   Notes  [facility] Medication [facility] [address] [place], [place] [address] [phone] --Must be a resident of [facility] -- Must have NO insurance coverage whatsoever (no Medicaid/ Medicare, etc.) -- The pt. MUST have a primary care doctor that directs their care regularly and follows them in the community   Med[facility]ist  [phone]    [employer]  [phone]    Agencies that provide inexpensive medical care: Organization         Address  Phone   Notes  [place]  [phone]   [facility]    [phone]   [facility] [place], [place] [address] [phone]   [place] [address], [place] [phone]   Planned Parenthood    [phone]   [facility]    [phone]   [place] and [address], [place] Phone:  [phone], Fax:  [phone] Hours of Operation:  9 am - 6 pm, M-F.  Also accepts Medicaid/Medicare and self-pay.  [place] for [place], [address], [place] Phone: [phone], Fax: [phone]. Hours of Operation:  8:30 am - 5:30 pm, M-F.  Also accepts Medicaid and self-pay.  [facility] [address], [place] Phone: [phone]   [place], [place], [place] [phone], Ext. [phone] Mondays & Thursdays: 7-9 AM.  First 15 patients are seen on a first come, first serve basis.    [place] Providers:  Organization         Address  Phone   Notes  [facility] [address], [place] [phone] Also accepts self-pay patients.  [facility] [address] [place], [place]  [phone]   [place], [address], [place] [phone]   [facility] [address], [place] [phone]   Lucianne Lei [address], [place]   [phone] Only accepts [place] Access [place] patients after they have their name applied to their card.   Self-Pay (no insurance) in [facility]:  Organization         Address  Phone   Notes  Sickle Cell Patients, [facility] [place] [phone]   [facility] [place] [phone]   [facility] [place]  [place], [place],  [phone]   [facility]/Dr. Osei-Bonsu  [address] [place] or [address], [place] [phone] Phone number for both [place] and [place] locations is the same.  [facility] [address] [place] [phone]   [facility] [address], [place] or [address] [phone]   [facility] [address]  [address], [place] [phone], phone; [phone], fax Sees patients 1st and 3rd Saturday of every month.  Must not qualify for public or private insurance (i.e. Medicaid, Medicare, [place] Health Choice, Veterans' Benefits)  Household income should be no more than 200% of the poverty level The clinic cannot treat you if you are pregnant or think you are pregnant  Sexually transmitted diseases are not treated at the clinic.    Dental Care: Organization         Address  Phone  Notes  [facility] Department of [facility] [place] [phone] Accepts children up to age 60 who are enrolled in [place] or [place]; pregnant women with a Medicaid card; and children who have applied for Medicaid or [place] Health Choice, but were declined, whose parents can pay a reduced fee at time of service.  [facility] Department of [facility]  [address], [place] [phone] Accepts children up to age 27 who are enrolled in [place] or [place]; pregnant women with a Medicaid card; and children who have applied for Medicaid or  Health Choice, but were declined, whose parents can pay a reduced fee at time of service.  [place] Adult Dental Access PROGRAM  [place] [phone] Patients are seen by appointment only. Walk-ins are not accepted. [place] will see patients 28 years of age and older. Monday - Tuesday (8am-5pm) Most Wednesdays (8:30-5pm) $30 per visit, cash only  [facility] Adult Dental Access PROGRAM  [address], [facility] [phone] Patients are seen by appointment only. Walk-ins are not accepted. [place] will see patients 57 years of age and older. One Wednesday Evening (Monthly: Volunteer Based).  $30 per visit, cash only  [place]  [phone] for adults; Children under age 78, call Graduate Pediatric Dentistry at [phone]. Children aged 40-14, please call [phone] to request a pediatric application.  Dental services are provided in all areas of dental care including fillings, crowns and bridges, complete and partial dentures, implants, gum treatment, root canals, and extractions. Preventive care is also provided. Treatment is provided to both adults and children. Patients are selected via a lottery and there is often a waiting list.   [facility] [address], [place]  [phone] [email or web address]   [facility] [address] [place], [place] [phone], Ext. [phone] Second and Fourth Thursday of each month, opens at 6:30 AM; Clinic ends at 9 AM.  Patients are seen on a first-come first-served basis, and a limited number are seen during each clinic.   [facility]  [address] Hillard Danker [place], [place] [phone]   Eligibility Requirements You must have lived in [place], [place], or [place] counties for at least the last three months.   You cannot be eligible for state or federal sponsored [employer], including [employer], [place], or [employer].   You generally cannot be eligible for healthcare insurance through your employer.    How to apply: Eligibility screenings are held every Tuesday and Wednesday afternoon  from 1:00 pm until 4:00 pm. You do not need an appointment for the interview!  [facility] [address] [place], [place]   [place]  [place]  [place]  [phone]  Behavioral Health Resources in the Community: Intensive Outpatient Programs Organization         Address  Phone  Notes  [place] [address] [place], [place] [phone]   [facility] [address], [place], [place]   ADS: [facility] [address], [place], [place]   [place] [address]  [place], [place] or [phone]   Substance Abuse Resources Organization         Address  Phone  Notes  Al[facility] Drug Services  [phone]   [place]  [phone]   [facility] [place]   Chinita Pester  [phone]   Residential & Outpatient Substance Abuse Program  [phone]   Psychological Services Organization         Address  Phone  Notes  [facility]  [place]  [phone]   [place] [address] [place] or [phone]    Mobile Crisis Teams Organization         Address  Phone  Notes  [facility], Mobile Crisis Care Unit  [phone]   [facility]  [address] [place], [place]   [place] [address], [place] [phone]    Self-Help/Support Groups Organization         Address  Phone             Notes  [place] of [place] - variety of support groups  [place] Call for more information  Narcotics Anonymous (NA), [facility] [address], [employer] [place]  2 meetings at this location   Materials engineer         Address  Phone  Notes  [facility] [place],    [place]  [phone]   [facility]  [address], [place] [phone], [place], [place]   [place], [place] [phone] Admissions: 8am-3pm M-F  Incentives Substance [place] [address],    [place], [place] [phone]   [facility] [address] [place], [place], [place]   [facility] [address]  [place], [place]   [facility] - Intensive Outpatient [address] [place] [address], [place], [address] [place]   [facility] ([place].,  [place], [place] [phone] or [phone]   [facility] (RTS) [address], [place], [place] Accepts Medicaid  [facility] [place] [address],  [place] [phone] Substance Abuse/Addiction Treatment   [facility] Organization         Address  Phone  Notes  [facility]  [phone]   Domenic Schwab, PhD [address] Arlis Porta [place], [place]   [phone] or [phone]   [place] [address] [place], [place] [phone]   [facility] [address], [place], [place] [phone] Insurance/Medicaid/sponsorship through [employer] and Families [address], [place]                                    [place], [place] [phone] [place],  [place] [phone]    Dr. Adele Schilder  [phone]   [facility] Dept. 1) [address], [place] 2) [place] 3)  [address], [place] [phone]  [phone]   [place] [phone] or [phone] (After Hours)

## 2014-03-03 NOTE — ED Notes (Signed)
Pt c/o URi symptoms x 2 days , seen by PMD x 1 day ago taking amoxil

## 2014-10-09 ENCOUNTER — Emergency Department (HOSPITAL_BASED_OUTPATIENT_CLINIC_OR_DEPARTMENT_OTHER)
Admission: EM | Admit: 2014-10-09 | Discharge: 2014-10-09 | Disposition: A | Discharge status: Home / Self Care | Payer: BC Managed Care – PPO | Attending: Emergency Medicine | Admitting: Emergency Medicine

## 2014-10-09 ENCOUNTER — Encounter (HOSPITAL_BASED_OUTPATIENT_CLINIC_OR_DEPARTMENT_OTHER): Payer: Self-pay

## 2014-10-09 DIAGNOSIS — Z8742 Personal history of other diseases of the female genital tract: Secondary | ICD-10-CM | POA: Diagnosis not present

## 2014-10-09 DIAGNOSIS — Z862 Personal history of diseases of the blood and blood-forming organs and certain disorders involving the immune mechanism: Secondary | ICD-10-CM | POA: Diagnosis not present

## 2014-10-09 DIAGNOSIS — M542 Cervicalgia: Secondary | ICD-10-CM | POA: Diagnosis present

## 2014-10-09 DIAGNOSIS — M62838 Other muscle spasm: Secondary | ICD-10-CM | POA: Diagnosis not present

## 2014-10-09 DIAGNOSIS — G8929 Other chronic pain: Secondary | ICD-10-CM | POA: Diagnosis not present

## 2014-10-09 DIAGNOSIS — Z720 Tobacco use: Secondary | ICD-10-CM | POA: Diagnosis not present

## 2014-10-09 MED ORDER — METHOCARBAMOL 500 MG PO TABS: 500.0000 mg | ORAL_TABLET | Freq: Two times a day (BID) | ORAL | Status: DC

## 2014-10-09 MED ORDER — IBUPROFEN 600 MG PO TABS
600.0000 mg | ORAL_TABLET | Freq: Four times a day (QID) | ORAL | Status: DC | PRN
Start: 2014-10-09 — End: 2014-12-07

## 2014-10-09 MED ORDER — IBUPROFEN 800 MG PO TABS
800.0000 mg | ORAL_TABLET | Freq: Once | ORAL | Status: AC
  Administered 2014-10-09: 800 mg via ORAL

## 2014-10-09 MED ORDER — IBUPROFEN 800 MG PO TABS
ORAL_TABLET | ORAL | Status: AC
  Filled 2014-10-09: qty 1

## 2014-10-09 NOTE — ED Provider Notes (Signed)
CSN: [id number]     Arrival date & time 10/09/14  0035 History   First MD Initiated Contact with Patient 10/09/14 0047     Chief Complaint  Patient presents with  . Neck Pain     (Consider location/radiation/quality/duration/timing/severity/associated sxs/prior Treatment) Patient is a 38 y.o. female presenting with neck pain. The history is provided by the patient.  Neck Pain Pain location:  L side Quality:  Cramping Pain radiates to:  Does not radiate Pain severity:  Moderate Pain is:  Same all the time Onset quality:  Sudden Timing:  Constant Progression:  Unchanged Chronicity:  New Context: not MVA   Relieved by:  Nothing Worsened by:  Nothing tried Associated symptoms: no bladder incontinence, no bowel incontinence, no chest pain, no fever, no leg pain, no numbness, no paresis, no photophobia, no visual change, no weakness and no weight loss   Risk factors: no hx of head and neck radiation and no hx of spinal trauma     Past Medical History  Diagnosis Date  . Fibroid, uterus   . Chronic pelvic pain in female   . Anemia   . Blood transfusion 2000  . Active smoker   . Endometriosis   . Headache(784.0)   . Blood transfusion without reported diagnosis    Past Surgical History  Procedure Laterality Date  . Hysteroscopy w/d&c  10-26-10    polypectomy  . Ganglion cyst excision  age 42    right wrist  . Laparoscopy  10/01/2011    Procedure: LAPAROSCOPY DIAGNOSTIC;  Surgeon: Margarette Asal;  Location: [facility];  Service: Gynecology;  Laterality: N/A;  . Abdominal hysterectomy  12/11/2011    Procedure: HYSTERECTOMY ABDOMINAL;  Surgeon: Margarette Asal, MD;  Location: [facility];  Service: Gynecology;  Laterality: N/A;  . Salpingoophorectomy  12/11/2011    Procedure: SALPINGO OOPHERECTOMY;  Surgeon: Margarette Asal, MD;  Location: [facility];  Service: Gynecology;  Laterality: Bilateral;   Family History  Problem Relation Age of Onset  . Diabetes  Maternal Grandmother   . Hypertension Maternal Grandmother   . Heart disease Maternal Grandmother    History  Substance Use Topics  . Smoking status: Current Every Day Smoker -- 0.50 packs/day for 6 years    Types: Cigarettes    Last Attempt to Quit: 03/04/2013  . Smokeless tobacco: Never Used  . Alcohol Use: No   OB History    Gravida Para Term Preterm AB TAB SAB Ectopic Multiple Living   0              Review of Systems  Constitutional: Negative for fever and weight loss.  HENT: Negative for drooling, sore throat, trouble swallowing and voice change.   Eyes: Negative for photophobia.  Respiratory: Negative for shortness of breath.   Cardiovascular: Negative for chest pain.  Gastrointestinal: Negative for bowel incontinence.  Genitourinary: Negative for bladder incontinence.  Musculoskeletal: Positive for neck pain. Negative for neck stiffness.  Neurological: Negative for weakness and numbness.  All other systems reviewed and are negative.     Allergies  Percocet  Home Medications   Prior to Admission medications   Medication Sig Start Date End Date Taking? Authorizing Provider  diphenhydrAMINE (BENADRYL) 25 MG tablet Take 1 tablet (25 mg total) by mouth every 6 (six) hours as needed for itching. 07/01/12 10/09/14 Yes Fredia Sorrow, MD  DiphenhydrAMINE HCl, Sleep, (ZZZQUIL) 25 MG CAPS Take 2 capsules by mouth daily as needed. For sleep.   Yes Historical  Provider, MD  ibuprofen (ADVIL,MOTRIN) 600 MG tablet Take 1 tablet (600 mg total) by mouth 3 (three) times daily. 05/11/13  Yes Julianne Rice, MD  ibuprofen (ADVIL,MOTRIN) 800 MG tablet Take 800 mg by mouth every 8 (eight) hours as needed. pain   Yes Historical Provider, MD  ibuprofen (ADVIL,MOTRIN) 600 MG tablet Take 1 tablet (600 mg total) by mouth every 6 (six) hours as needed. 10/09/14   Jerrick Farve K Keisean Skowron-Rasch, MD  methocarbamol (ROBAXIN) 500 MG tablet Take 1 tablet (500 mg total) by mouth 2 (two) times daily.  10/09/14   Candice Tobey K Lucifer Soja-Rasch, MD   BP 133/77 mmHg  Pulse 88  Temp(Src) 98.2 F (36.8 C) (Oral)  Resp 16  Ht 5\' 7"  (1.702 m)  Wt 230 lb (104.327 kg)  BMI 36.01 kg/m2  SpO2 100%  LMP 12/14/2010 Physical Exam  Constitutional: She is oriented to person, place, and time. She appears well-developed. No distress.  HENT:  Head: Normocephalic and atraumatic.  Mouth/Throat: Oropharynx is clear and moist.  Eyes: Conjunctivae and EOM are normal.  Neck: Normal range of motion. Neck supple. No tracheal deviation present. No thyromegaly present.  Supple pain correlated with course of the left sternocleidomastoid.  No nodes in the neck nor supraclavicular fossa  Cardiovascular: Normal rate, regular rhythm and intact distal pulses.   Pulmonary/Chest: Effort normal and breath sounds normal. No stridor. She has no wheezes. She has no rales.  Abdominal: Soft. Bowel sounds are normal. There is no tenderness. There is no rebound and no guarding.  Musculoskeletal: Normal range of motion.  Lymphadenopathy:    She has no cervical adenopathy.  Neurological: She is alert and oriented to person, place, and time.  Intact grip strength is texting at the time of arrival  Skin: Skin is warm and dry.  Psychiatric: She has a normal mood and affect.    ED Course  Procedures (including critical care time) Labs Review Labs Reviewed - No data to display  Imaging Review No results found.   EKG Interpretation None      MDM   Final diagnoses:  Muscle spasm    Muscle spasm of the neck: Ibuprofen, robaxin and heat therapy.  Follow up with your doctor for ongoing care.      Carlisle Beers, MD 10/09/14 [phone]

## 2014-10-09 NOTE — Discharge Instructions (Signed)
Heat Therapy °Heat therapy can help make painful, stiff muscles and joints feel better. Do not use heat on new injuries. Wait at least 48 hours after an injury to use heat. Do not use heat when you have aches or pains right after an activity. If you still have pain 3 hours after stopping the activity, then you may use heat. °HOME CARE °Wet heat pack °· Soak a clean towel in warm water. Squeeze out the extra water. °· Put the warm, wet towel in a plastic bag. °· Place a thin, dry towel between your skin and the bag. °· Put the heat pack on the area for 5 minutes, and check your skin. Your skin may be pink, but it should not be red. °· Leave the heat pack on the area for 15 to 30 minutes. °· Repeat this every 2 to 4 hours while awake. Do not use heat while you are sleeping. °Warm water bath °· Fill a tub with warm water. °· Place the affected body part in the tub. °· Soak the area for 20 to 40 minutes. °· Repeat as needed. °Hot water bottle °· Fill the water bottle half full with hot water. °· Press out the extra air. Close the cap tightly. °· Place a dry towel between your skin and the bottle. °· Put the bottle on the area for 5 minutes, and check your skin. Your skin may be pink, but it should not be red. °· Leave the bottle on the area for 15 to 30 minutes. °· Repeat this every 2 to 4 hours while awake. °Electric heating pad °· Place a dry towel between your skin and the heating pad. °· Set the heating pad on low heat. °· Put the heating pad on the area for 10 minutes, and check your skin. Your skin may be pink, but it should not be red. °· Leave the heating pad on the area for 20 to 40 minutes. °· Repeat this every 2 to 4 hours while awake. °· Do not lie on the heating pad. °· Do not fall asleep while using the heating pad. °· Do not use the heating pad near water. °GET HELP RIGHT AWAY IF: °· You get blisters or red skin. °· Your skin is puffy (swollen), or you lose feeling (numbness) in the affected area. °· You  have any new problems. °· Your problems are getting worse. °· You have any questions or concerns. °If you have any problems, stop using heat therapy until you see your doctor. °MAKE SURE YOU: °· Understand these instructions. °· Will watch your condition. °· Will get help right away if you are not doing well or get worse. °Document Released: 01/27/2012 Document Reviewed: 12/28/2013 °[employer]® Patient Information ©2015 [employer]. This information is not intended to replace advice given to you by your health care provider. Make sure you discuss any questions you have with your health care provider. ° °

## 2014-10-09 NOTE — ED Notes (Addendum)
Pt reports left sided neck pain that began Saturday morning - denies known injury - states it feels like there is a "knot" there. Pt also reports a 2 week history of tingling sensation to bilateral fingers.

## 2014-11-02 ENCOUNTER — Emergency Department (HOSPITAL_BASED_OUTPATIENT_CLINIC_OR_DEPARTMENT_OTHER)
Admission: EM | Admit: 2014-11-02 | Discharge: 2014-11-02 | Discharge status: Against Medical Advice | Payer: BC Managed Care – PPO | Attending: Emergency Medicine | Admitting: Emergency Medicine

## 2014-11-02 ENCOUNTER — Encounter (HOSPITAL_BASED_OUTPATIENT_CLINIC_OR_DEPARTMENT_OTHER): Payer: Self-pay | Admitting: Emergency Medicine

## 2014-11-02 DIAGNOSIS — Z79899 Other long term (current) drug therapy: Secondary | ICD-10-CM | POA: Insufficient documentation

## 2014-11-02 DIAGNOSIS — Z8742 Personal history of other diseases of the female genital tract: Secondary | ICD-10-CM | POA: Insufficient documentation

## 2014-11-02 DIAGNOSIS — Z720 Tobacco use: Secondary | ICD-10-CM | POA: Insufficient documentation

## 2014-11-02 DIAGNOSIS — Z862 Personal history of diseases of the blood and blood-forming organs and certain disorders involving the immune mechanism: Secondary | ICD-10-CM | POA: Diagnosis not present

## 2014-11-02 DIAGNOSIS — G8929 Other chronic pain: Secondary | ICD-10-CM | POA: Diagnosis not present

## 2014-11-02 DIAGNOSIS — R531 Weakness: Secondary | ICD-10-CM | POA: Diagnosis present

## 2014-11-02 DIAGNOSIS — Z1389 Encounter for screening for other disorder: Secondary | ICD-10-CM | POA: Diagnosis not present

## 2014-11-02 DIAGNOSIS — Z139 Encounter for screening, unspecified: Secondary | ICD-10-CM

## 2014-11-02 LAB — URINALYSIS, ROUTINE W REFLEX MICROSCOPIC
Bilirubin Urine: NEGATIVE
Glucose, UA: NEGATIVE mg/dL
Ketones, ur: NEGATIVE mg/dL
LEUKOCYTES UA: NEGATIVE
Nitrite: NEGATIVE
Protein, ur: NEGATIVE mg/dL
SPECIFIC GRAVITY, URINE: 1.022 (ref 1.005–1.030)
UROBILINOGEN UA: 0.2 mg/dL (ref 0.0–1.0)
pH: 5.5 (ref 5.0–8.0)

## 2014-11-02 LAB — URINE MICROSCOPIC-ADD ON

## 2014-11-02 LAB — CBG MONITORING, ED: Glucose-Capillary: 103 mg/dL — ABNORMAL HIGH (ref 70–99)

## 2014-11-02 NOTE — ED Notes (Signed)
Weakness, dry mouth, and urinary frequency x1 month.  Was told by the dentist that she has type 2 DM.  Pt sts she does not have a pmd and cannot get an appt until the end of January.  Wants to be checked for diabetes.

## 2014-11-02 NOTE — ED Provider Notes (Signed)
CSN: [id number]     Arrival date & time 11/02/14  0141 History   First MD Initiated Contact with Patient 11/02/14 0310     Chief Complaint  Patient presents with  . Weakness     (Consider location/radiation/quality/duration/timing/severity/associated sxs/prior Treatment) HPI  This is a 38 year old female who was told at an employed sponsored health screening last month that her blood sugar was borderline high. She was seen by her dentist yesterday and one at the staff told her she thought she had type 2 diabetes; the patient does not know why the staff member told her that. She does admit to weakness, increased thirst and increased urination for about a month. She's been trying to get an appointment with primary care physician but all are booked until well into next year. She denies dysuria, chest pain, shortness of breath or abdominal pain. Her sugar on arrival was 103.  Past Medical History  Diagnosis Date  . Fibroid, uterus   . Chronic pelvic pain in female   . Anemia   . Blood transfusion 2000  . Active smoker   . Endometriosis   . Headache(784.0)   . Blood transfusion without reported diagnosis    Past Surgical History  Procedure Laterality Date  . Hysteroscopy w/d&c  10-26-10    polypectomy  . Ganglion cyst excision  age 11    right wrist  . Laparoscopy  10/01/2011    Procedure: LAPAROSCOPY DIAGNOSTIC;  Surgeon: Margarette Asal;  Location: [facility];  Service: Gynecology;  Laterality: N/A;  . Abdominal hysterectomy  12/11/2011    Procedure: HYSTERECTOMY ABDOMINAL;  Surgeon: Margarette Asal, MD;  Location: [facility];  Service: Gynecology;  Laterality: N/A;  . Salpingoophorectomy  12/11/2011    Procedure: SALPINGO OOPHERECTOMY;  Surgeon: Margarette Asal, MD;  Location: [facility];  Service: Gynecology;  Laterality: Bilateral;   Family History  Problem Relation Age of Onset  . Diabetes Maternal Grandmother   . Hypertension Maternal Grandmother   . Heart  disease Maternal Grandmother    History  Substance Use Topics  . Smoking status: Current Every Day Smoker -- 0.50 packs/day for 6 years    Types: Cigarettes    Last Attempt to Quit: 03/04/2013  . Smokeless tobacco: Never Used  . Alcohol Use: No   OB History    Gravida Para Term Preterm AB TAB SAB Ectopic Multiple Living   0              Review of Systems  All other systems reviewed and are negative.   Allergies  Percocet  Home Medications   Prior to Admission medications   Medication Sig Start Date End Date Taking? Authorizing Provider  diphenhydrAMINE (BENADRYL) 25 MG tablet Take 1 tablet (25 mg total) by mouth every 6 (six) hours as needed for itching. 07/01/12 10/09/14  Fredia Sorrow, MD  DiphenhydrAMINE HCl, Sleep, (ZZZQUIL) 25 MG CAPS Take 2 capsules by mouth daily as needed. For sleep.    Historical Provider, MD  ibuprofen (ADVIL,MOTRIN) 600 MG tablet Take 1 tablet (600 mg total) by mouth 3 (three) times daily. 05/11/13   Julianne Rice, MD  ibuprofen (ADVIL,MOTRIN) 600 MG tablet Take 1 tablet (600 mg total) by mouth every 6 (six) hours as needed. 10/09/14   April K Palumbo-Rasch, MD  ibuprofen (ADVIL,MOTRIN) 800 MG tablet Take 800 mg by mouth every 8 (eight) hours as needed. pain    Historical Provider, MD  methocarbamol (ROBAXIN) 500 MG tablet Take 1 tablet (500  mg total) by mouth 2 (two) times daily. 10/09/14   April K Palumbo-Rasch, MD   BP 116/74 mmHg  Pulse 84  Temp(Src) 98.3 F (36.8 C) (Oral)  Resp 16  Ht 5\' 7"  (1.702 m)  Wt 228 lb (103.42 kg)  BMI 35.70 kg/m2  SpO2 100%  LMP 12/14/2010   Physical Exam General: Well-developed, well-nourished female in no acute distress; appearance consistent with age of record HENT: normocephalic; atraumatic; no evidence of thrush; mucous membranes moist Eyes: pupils equal, round and reactive to light; extraocular muscles intact Neck: supple Heart: regular rate and rhythm Lungs: clear to auscultation  bilaterally Abdomen: soft; nondistended; nontender; no masses or hepatosplenomegaly; bowel sounds present Extremities: No deformity; full range of motion; pulses normal Neurologic: Awake, alert and oriented; motor function intact in all extremities and symmetric; no facial droop Skin: Warm and dry Psychiatric: Normal mood and affect    ED Course  Procedures (including critical care time)   MDM   Nursing notes and vitals signs, including pulse oximetry, reviewed.  Summary of this visit's results, reviewed by myself:  Labs:  Results for orders placed or performed during the hospital encounter of 11/02/14 (from the past 24 hour(s))  CBG monitoring, ED     Status: Abnormal   Collection Time: 11/02/14  1:55 AM  Result Value Ref Range   Glucose-Capillary 103 (H) 70 - 99 mg/dL  Urinalysis, Routine w reflex microscopic     Status: Abnormal   Collection Time: 11/02/14  2:55 AM  Result Value Ref Range   Color, Urine YELLOW YELLOW   APPearance CLEAR CLEAR   Specific Gravity, Urine 1.022 1.005 - 1.030   pH 5.5 5.0 - 8.0   Glucose, UA NEGATIVE NEGATIVE mg/dL   Hgb urine dipstick SMALL (A) NEGATIVE   Bilirubin Urine NEGATIVE NEGATIVE   Ketones, ur NEGATIVE NEGATIVE mg/dL   Protein, ur NEGATIVE NEGATIVE mg/dL   Urobilinogen, UA 0.2 0.0 - 1.0 mg/dL   Nitrite NEGATIVE NEGATIVE   Leukocytes, UA NEGATIVE NEGATIVE  Urine microscopic-add on     Status: None   Collection Time: 11/02/14  2:55 AM  Result Value Ref Range   Squamous Epithelial / LPF RARE RARE   WBC, UA 0-2 <3 WBC/hpf   RBC / HPF 3-6 <3 RBC/hpf   3:52 AM Patient eloped before laboratories were resulted. There is no evidence of hyperglycemia at this time.  Wynetta Fines, MD 11/02/14 [phone]

## 2014-12-06 ENCOUNTER — Emergency Department (HOSPITAL_BASED_OUTPATIENT_CLINIC_OR_DEPARTMENT_OTHER)
Admission: EM | Admit: 2014-12-06 | Discharge: 2014-12-07 | Disposition: A | Discharge status: Home / Self Care | Payer: BLUE CROSS/BLUE SHIELD | Attending: Emergency Medicine | Admitting: Emergency Medicine

## 2014-12-06 ENCOUNTER — Emergency Department (HOSPITAL_BASED_OUTPATIENT_CLINIC_OR_DEPARTMENT_OTHER): Payer: BLUE CROSS/BLUE SHIELD

## 2014-12-06 ENCOUNTER — Encounter (HOSPITAL_BASED_OUTPATIENT_CLINIC_OR_DEPARTMENT_OTHER): Payer: Self-pay | Admitting: Emergency Medicine

## 2014-12-06 DIAGNOSIS — Z862 Personal history of diseases of the blood and blood-forming organs and certain disorders involving the immune mechanism: Secondary | ICD-10-CM | POA: Insufficient documentation

## 2014-12-06 DIAGNOSIS — Z9089 Acquired absence of other organs: Secondary | ICD-10-CM | POA: Insufficient documentation

## 2014-12-06 DIAGNOSIS — Z87448 Personal history of other diseases of urinary system: Secondary | ICD-10-CM | POA: Diagnosis not present

## 2014-12-06 DIAGNOSIS — R0781 Pleurodynia: Secondary | ICD-10-CM

## 2014-12-06 DIAGNOSIS — G8929 Other chronic pain: Secondary | ICD-10-CM | POA: Insufficient documentation

## 2014-12-06 DIAGNOSIS — R109 Unspecified abdominal pain: Secondary | ICD-10-CM

## 2014-12-06 DIAGNOSIS — R1012 Left upper quadrant pain: Secondary | ICD-10-CM | POA: Diagnosis not present

## 2014-12-06 DIAGNOSIS — Z3202 Encounter for pregnancy test, result negative: Secondary | ICD-10-CM | POA: Insufficient documentation

## 2014-12-06 DIAGNOSIS — Z9049 Acquired absence of other specified parts of digestive tract: Secondary | ICD-10-CM | POA: Diagnosis not present

## 2014-12-06 DIAGNOSIS — K5900 Constipation, unspecified: Secondary | ICD-10-CM | POA: Diagnosis not present

## 2014-12-06 DIAGNOSIS — K625 Hemorrhage of anus and rectum: Secondary | ICD-10-CM | POA: Diagnosis present

## 2014-12-06 LAB — CBC WITH DIFFERENTIAL/PLATELET
BASOS ABS: 0 10*3/uL (ref 0.0–0.1)
Basophils Relative: 0 % (ref 0–1)
Eosinophils Absolute: 0.1 10*3/uL (ref 0.0–0.7)
Eosinophils Relative: 1 % (ref 0–5)
HCT: 39.2 % (ref 36.0–46.0)
HEMOGLOBIN: 12.8 g/dL (ref 12.0–15.0)
Lymphocytes Relative: 45 % (ref 12–46)
Lymphs Abs: 3.8 10*3/uL (ref 0.7–4.0)
MCH: 28.1 pg (ref 26.0–34.0)
MCHC: 32.7 g/dL (ref 30.0–36.0)
MCV: 86 fL (ref 78.0–100.0)
MONOS PCT: 7 % (ref 3–12)
Monocytes Absolute: 0.6 10*3/uL (ref 0.1–1.0)
NEUTROS PCT: 47 % (ref 43–77)
Neutro Abs: 3.9 10*3/uL (ref 1.7–7.7)
Platelets: 363 10*3/uL (ref 150–400)
RBC: 4.56 MIL/uL (ref 3.87–5.11)
RDW: 13.2 % (ref 11.5–15.5)
WBC: 8.4 10*3/uL (ref 4.0–10.5)

## 2014-12-06 LAB — OCCULT BLOOD X 1 CARD TO LAB, STOOL: FECAL OCCULT BLD: NEGATIVE

## 2014-12-06 LAB — URINALYSIS, ROUTINE W REFLEX MICROSCOPIC
BILIRUBIN URINE: NEGATIVE
Glucose, UA: NEGATIVE mg/dL
KETONES UR: NEGATIVE mg/dL
Leukocytes, UA: NEGATIVE
Nitrite: NEGATIVE
PH: 6 (ref 5.0–8.0)
PROTEIN: NEGATIVE mg/dL
SPECIFIC GRAVITY, URINE: 1.008 (ref 1.005–1.030)
Urobilinogen, UA: 0.2 mg/dL (ref 0.0–1.0)

## 2014-12-06 LAB — URINE MICROSCOPIC-ADD ON

## 2014-12-06 LAB — PREGNANCY, URINE: Preg Test, Ur: NEGATIVE

## 2014-12-06 MED ORDER — HYDROCODONE-ACETAMINOPHEN 5-325 MG PO TABS: 1.0000 | ORAL_TABLET | Freq: Once | ORAL | Status: DC

## 2014-12-06 MED ORDER — IBUPROFEN 800 MG PO TABS
800.0000 mg | ORAL_TABLET | Freq: Once | ORAL | Status: AC
  Administered 2014-12-07: 800 mg via ORAL
  Filled 2014-12-06: qty 1

## 2014-12-06 NOTE — ED Provider Notes (Signed)
CSN: [id number]     Arrival date & time 12/06/14  2224 History  This chart was scribed for Ezequiel Essex, MD by Randa Evens, ED Scribe. This patient was seen in room MH05/MH05 and the patient's care was started at 11:05 PM.    Chief Complaint  Patient presents with  . Rectal Bleeding   The history is provided by the patient. No language interpreter was used.   HPI Comments: Stacey Weeks is a 39 y.o. female with PSHx of hysterectomy who presents to the Emergency Department complaining of new LUQ abdominal pain onset 1 day prior. Pt states she is having associated shooting right flank pain onset at 7 PM. Pt states she had some CP as well after drinking fluids and then "I spat the fluids out." Pt states that she has noticed some blood in her stool. Pt states that prior to noticing the blood in her stool she took a stool softener due to being constipated. Pt denies any alleviating factors. Pt denies cough, dysuria or hematuria.  Pt denies any recent falls or injuries.    Past Medical History  Diagnosis Date  . Fibroid, uterus   . Chronic pelvic pain in female   . Anemia   . Blood transfusion 2000  . Active smoker   . Endometriosis   . Headache(784.0)   . Blood transfusion without reported diagnosis    Past Surgical History  Procedure Laterality Date  . Hysteroscopy w/d&c  10-26-10    polypectomy  . Ganglion cyst excision  age 76    right wrist  . Laparoscopy  10/01/2011    Procedure: LAPAROSCOPY DIAGNOSTIC;  Surgeon: Margarette Asal;  Location: [facility];  Service: Gynecology;  Laterality: N/A;  . Abdominal hysterectomy  12/11/2011    Procedure: HYSTERECTOMY ABDOMINAL;  Surgeon: Margarette Asal, MD;  Location: [facility];  Service: Gynecology;  Laterality: N/A;  . Salpingoophorectomy  12/11/2011    Procedure: SALPINGO OOPHERECTOMY;  Surgeon: Margarette Asal, MD;  Location: [facility];  Service: Gynecology;  Laterality: Bilateral;   Family History  Problem  Relation Age of Onset  . Diabetes Maternal Grandmother   . Hypertension Maternal Grandmother   . Heart disease Maternal Grandmother    History  Substance Use Topics  . Smoking status: Current Every Day Smoker -- 0.50 packs/day for 6 years    Types: Cigarettes    Last Attempt to Quit: 03/04/2013  . Smokeless tobacco: Never Used  . Alcohol Use: No   OB History    Gravida Para Term Preterm AB TAB SAB Ectopic Multiple Living   0               Review of Systems  Respiratory: Negative for cough.   Gastrointestinal: Positive for abdominal pain, constipation and blood in stool. Negative for diarrhea.  Genitourinary: Positive for flank pain. Negative for dysuria and hematuria.  All other systems reviewed and are negative.    Allergies  Percocet  Home Medications   Prior to Admission medications   Medication Sig Start Date End Date Taking? Authorizing Provider  diphenhydrAMINE (BENADRYL) 25 MG tablet Take 1 tablet (25 mg total) by mouth every 6 (six) hours as needed for itching. 07/01/12 10/09/14  Fredia Sorrow, MD  DiphenhydrAMINE HCl, Sleep, (ZZZQUIL) 25 MG CAPS Take 2 capsules by mouth daily as needed. For sleep.    Historical Provider, MD  ibuprofen (ADVIL,MOTRIN) 800 MG tablet Take 1 tablet (800 mg total) by mouth 3 (three) times daily. 12/07/14  Ezequiel Essex, MD  methocarbamol (ROBAXIN) 500 MG tablet Take 1 tablet (500 mg total) by mouth 2 (two) times daily. 10/09/14   April K Palumbo-Rasch, MD   BP 120/67 mmHg  Pulse 72  Temp(Src) 98 F (36.7 C) (Oral)  Resp 18  Ht 5\' 7"  (1.702 m)  Wt 228 lb (103.42 kg)  BMI 35.70 kg/m2  SpO2 100%  LMP 12/14/2010   Physical Exam  Constitutional: She is oriented to person, place, and time. She appears well-developed and well-nourished. No distress.  HENT:  Head: Normocephalic and atraumatic.  Mouth/Throat: Oropharynx is clear and moist. No oropharyngeal exudate.  Eyes: Conjunctivae and EOM are normal. Pupils are equal, round,  and reactive to light.  Neck: Normal range of motion. Neck supple.  No meningismus.  Cardiovascular: Normal rate, regular rhythm, normal heart sounds and intact distal pulses.   No murmur heard. Pulmonary/Chest: Effort normal and breath sounds normal. No respiratory distress. She exhibits tenderness.  Tender to left lateral and anterior  ribs, no crepitus, no ecchymosis.   Abdominal: Soft. There is tenderness in the left upper quadrant. There is no rebound, no guarding and no CVA tenderness.  No RUQ tenderness.    Genitourinary: Rectal exam shows no external hemorrhoid and no internal hemorrhoid.  No gross blood. Chaperone present.   Musculoskeletal: Normal range of motion. She exhibits no edema or tenderness.  Neurological: She is alert and oriented to person, place, and time. No cranial nerve deficit. She exhibits normal muscle tone. Coordination normal.  No ataxia on finger to nose bilaterally. No pronator drift. 5/5 strength throughout. CN 2-12 intact. Negative Romberg. Equal grip strength. Sensation intact. Gait is normal.   Skin: Skin is warm.  Psychiatric: She has a normal mood and affect. Her behavior is normal.  Nursing note and vitals reviewed.   ED Course  Procedures (including critical care time) DIAGNOSTIC STUDIES: Oxygen Saturation is 100% on RA, normal by my interpretation.    COORDINATION OF CARE: 11:15 PM-Discussed treatment plan with pt at bedside and pt agreed to plan.     Labs Review Labs Reviewed  URINALYSIS, ROUTINE W REFLEX MICROSCOPIC - Abnormal; Notable for the following:    Hgb urine dipstick TRACE (*)    All other components within normal limits  COMPREHENSIVE METABOLIC PANEL - Abnormal; Notable for the following:    Potassium 3.4 (*)    All other components within normal limits  URINE MICROSCOPIC-ADD ON  PREGNANCY, URINE  CBC WITH DIFFERENTIAL  LIPASE, BLOOD  OCCULT BLOOD X 1 CARD TO LAB, STOOL    Imaging Review Dg Abd Acute  W/chest  12/07/2014   CLINICAL DATA:  New left upper quadrant abdominal pain, onset for 1 day. Patient also having shooting right flank pain onset tonight.  EXAM: ACUTE ABDOMEN SERIES (ABDOMEN 2 VIEW & CHEST 1 VIEW)  COMPARISON:  09/20/2012  FINDINGS: Renal shadows are mostly obscured by overlying bowel gas. No convincing renal or ureteral stones.  Normal bowel gas pattern.  No obstruction or free air.  Heart, mediastinum hila are unremarkable.  Clear lungs.  No significant bony abnormality.  IMPRESSION: Negative abdominal radiographs.  No acute cardiopulmonary disease.   Electronically Signed   By: Lajean Manes M.D.   On: 12/07/2014 00:12   Ct Renal Stone Study  12/07/2014   CLINICAL DATA:  LEFT upper quadrant pain, hematochezia, constipation. History of hysterectomy.  EXAM: CT ABDOMEN AND PELVIS WITHOUT CONTRAST  TECHNIQUE: Multidetector CT imaging of the abdomen and pelvis was performed following  the standard protocol without IV contrast.  COMPARISON:  CT of the abdomen and pelvis September 21, 2012  FINDINGS: LUNG BASES: Included view of the lung bases are clear. The visualized heart and pericardium are unremarkable.  KIDNEYS/BLADDER: Kidneys are orthotopic, demonstrating normal size and morphology. Mild LEFT greater than RIGHT hydronephrosis, new. No nephrolithiasis; limited assessment for renal masses on this nonenhanced examination. The unopacified ureters are normal in course and caliber. Urinary bladder is partially distended and unremarkable.  SOLID ORGANS: The liver, spleen, gallbladder, pancreas and adrenal glands are unremarkable for this non-contrast examination.  GASTROINTESTINAL TRACT: The stomach, small and large bowel are normal in course and caliber without inflammatory changes, the sensitivity may be decreased by lack of enteric contrast. Normal appendix.  PERITONEUM/RETROPERITONEUM: No intraperitoneal free fluid nor free air. Aortoiliac vessels are normal in course and caliber. No  lymphadenopathy by CT size criteria. Status post hysterectomy. Similar phleboliths in the pelvis.  SOFT TISSUES/ OSSEOUS STRUCTURES: Nonsuspicious. Mild sacroiliac osteoarthrosis. Moderate L5-S1 broad-based disc osteophyte complex, moderate L5-S1 neural foraminal narrowing.  IMPRESSION: Mild LEFT greater than RIGHT hydronephrosis without urolithiasis. Though this may reflect reflux, recommend correlation with urinary analysis.   Electronically Signed   By: Elon Alas   On: 12/07/2014 00:23     EKG Interpretation   Date/Time:  Tuesday December 06 2014 23:21:48 EST Ventricular Rate:  65 PR Interval:  160 QRS Duration: 88 QT Interval:  426 QTC Calculation: 443 R Axis:   72 Text Interpretation:  Normal sinus rhythm with sinus arrhythmia Normal ECG  No significant change was found Confirmed by Wyvonnia Dusky  MD, Ridhi Hoffert [phone])  on 12/06/2014 11:24:00 PM      MDM   Final diagnoses:  Rib pain  Flank pain   2 days of left upper abdominal pain and left lateral rib pain that is constant. Episode of bright red blood mixed with stool 2 after straining on toilet and using stool softener.  No hemorrhoids, FOBT negative.  Labs unremarkable. Chest x-ray shows no rib fracture. PERC negative.  Bilateral hydronephrosis on CT without obstruction.  UA with trace hematuria. Consider passed kidney stone. Advised patient she needs outpatient renal ultrasound as follow-up.  Pain may be secondary to passed kidney stone. Consider also possible early shingles without rash yet. PERC negative. No evidence of GI bleed. Treat symptoms, OTC stool softeners, follow up with PCP. Return precautions discussed.  I personally performed the services described in this documentation, which was scribed in my presence. The recorded information has been reviewed and is accurate.   Ezequiel Essex, MD 12/07/14 [phone]

## 2014-12-06 NOTE — ED Notes (Signed)
Pt c/o rt flank pain and left upper abd pain onset this pm, bright red blood in stool x 1 yesterday and x 1 today  w pain, alo feels like heart is tight, w diff swallowing fluids,  Feel like they go part way down and stops

## 2014-12-06 NOTE — ED Notes (Signed)
Pt states she is having blood in her stool and abd pain

## 2014-12-07 LAB — COMPREHENSIVE METABOLIC PANEL
ALBUMIN: 4.4 g/dL (ref 3.5–5.2)
ALT: 21 U/L (ref 0–35)
AST: 22 U/L (ref 0–37)
Alkaline Phosphatase: 86 U/L (ref 39–117)
Anion gap: 7 (ref 5–15)
BUN: 13 mg/dL (ref 6–23)
CALCIUM: 9.1 mg/dL (ref 8.4–10.5)
CO2: 26 mmol/L (ref 19–32)
Chloride: 105 mEq/L (ref 96–112)
Creatinine, Ser: 0.72 mg/dL (ref 0.50–1.10)
GFR calc Af Amer: >90 mL/min (ref >90)
Glucose, Bld: 90 mg/dL (ref 70–99)
Potassium: 3.4 mmol/L — ABNORMAL LOW (ref 3.5–5.1)
SODIUM: 138 mmol/L (ref 135–145)
Total Bilirubin: 0.5 mg/dL (ref 0.3–1.2)
Total Protein: 8.2 g/dL (ref 6.0–8.3)

## 2014-12-07 LAB — LIPASE, BLOOD: LIPASE: 32 U/L (ref 11–59)

## 2014-12-07 MED ORDER — IBUPROFEN 800 MG PO TABS: 800.0000 mg | ORAL_TABLET | Freq: Three times a day (TID) | ORAL | Status: DC

## 2014-12-07 NOTE — Discharge Instructions (Signed)
Flank Pain You may have passed a kidney stone. Follow-up with your doctor for an ultrasound of your kidneys. Watch your skin in your area of pain to see if you develop a rash as this may represent early shingles. Return to the ED if you develop new or worsening symptoms. Flank pain refers to pain that is located on the side of the body between the upper abdomen and the back. The pain may occur over a short period of time (acute) or may be long-term or reoccurring (chronic). It may be mild or severe. Flank pain can be caused by many things. CAUSES  Some of the more common causes of flank pain include:  Muscle strains.   Muscle spasms.   A disease of your spine (vertebral disk disease).   A lung infection (pneumonia).   Fluid around your lungs (pulmonary edema).   A kidney infection.   Kidney stones.   A very painful skin rash caused by the chickenpox virus (shingles).   Gallbladder disease.  [address] care will depend on the cause of your pain. In general,  Rest as directed by your caregiver.  Drink enough fluids to keep your urine clear or pale yellow.  Only take over-the-counter or prescription medicines as directed by your caregiver. Some medicines may help relieve the pain.  Tell your caregiver about any changes in your pain.  Follow up with your caregiver as directed. SEEK IMMEDIATE MEDICAL CARE IF:   Your pain is not controlled with medicine.   You have new or worsening symptoms.  Your pain increases.   You have abdominal pain.   You have shortness of breath.   You have persistent nausea or vomiting.   You have swelling in your abdomen.   You feel faint or pass out.   You have blood in your urine.  You have a fever or persistent symptoms for more than 2-3 days.  You have a fever and your symptoms suddenly get worse. MAKE SURE YOU:   Understand these instructions.  Will watch your condition.  Will get help right  away if you are not doing well or get worse. Document Released: 12/26/2005 Document Revised: 07/29/2012 Document Reviewed: 06/18/2012 [facility] Patient Information 2015 [place], [place]. This information is not intended to replace advice given to you by your health care provider. Make sure you discuss any questions you have with your health care provider.

## 2015-03-15 ENCOUNTER — Encounter (HOSPITAL_BASED_OUTPATIENT_CLINIC_OR_DEPARTMENT_OTHER): Payer: Self-pay | Admitting: Emergency Medicine

## 2015-03-15 ENCOUNTER — Emergency Department (HOSPITAL_BASED_OUTPATIENT_CLINIC_OR_DEPARTMENT_OTHER)
Admission: EM | Admit: 2015-03-15 | Discharge: 2015-03-15 | Disposition: A | Discharge status: Home / Self Care | Payer: BLUE CROSS/BLUE SHIELD | Attending: Emergency Medicine | Admitting: Emergency Medicine

## 2015-03-15 DIAGNOSIS — Z862 Personal history of diseases of the blood and blood-forming organs and certain disorders involving the immune mechanism: Secondary | ICD-10-CM | POA: Insufficient documentation

## 2015-03-15 DIAGNOSIS — G8929 Other chronic pain: Secondary | ICD-10-CM | POA: Diagnosis not present

## 2015-03-15 DIAGNOSIS — R300 Dysuria: Secondary | ICD-10-CM | POA: Diagnosis not present

## 2015-03-15 DIAGNOSIS — R1030 Lower abdominal pain, unspecified: Secondary | ICD-10-CM | POA: Diagnosis present

## 2015-03-15 DIAGNOSIS — Z90710 Acquired absence of both cervix and uterus: Secondary | ICD-10-CM | POA: Insufficient documentation

## 2015-03-15 DIAGNOSIS — Z8742 Personal history of other diseases of the female genital tract: Secondary | ICD-10-CM | POA: Insufficient documentation

## 2015-03-15 DIAGNOSIS — Z720 Tobacco use: Secondary | ICD-10-CM | POA: Diagnosis not present

## 2015-03-15 DIAGNOSIS — Z9889 Other specified postprocedural states: Secondary | ICD-10-CM | POA: Insufficient documentation

## 2015-03-15 DIAGNOSIS — K649 Unspecified hemorrhoids: Secondary | ICD-10-CM | POA: Diagnosis not present

## 2015-03-15 DIAGNOSIS — K5900 Constipation, unspecified: Secondary | ICD-10-CM | POA: Insufficient documentation

## 2015-03-15 LAB — URINALYSIS, ROUTINE W REFLEX MICROSCOPIC
Bilirubin Urine: NEGATIVE
GLUCOSE, UA: NEGATIVE mg/dL
KETONES UR: NEGATIVE mg/dL
Leukocytes, UA: NEGATIVE
Nitrite: NEGATIVE
Protein, ur: NEGATIVE mg/dL
SPECIFIC GRAVITY, URINE: 1.022 (ref 1.005–1.030)
Urobilinogen, UA: 1 mg/dL (ref 0.0–1.0)
pH: 7.5 (ref 5.0–8.0)

## 2015-03-15 LAB — URINE MICROSCOPIC-ADD ON

## 2015-03-15 MED ORDER — PHENAZOPYRIDINE HCL 100 MG PO TABS
200.0000 mg | ORAL_TABLET | Freq: Once | ORAL | Status: AC
  Administered 2015-03-15: 200 mg via ORAL
  Filled 2015-03-15: qty 2

## 2015-03-15 MED ORDER — PHENAZOPYRIDINE HCL 200 MG PO TABS: 200.0000 mg | ORAL_TABLET | Freq: Three times a day (TID) | ORAL | Status: DC

## 2015-03-15 MED ORDER — HYDROCODONE-ACETAMINOPHEN 5-325 MG PO TABS
1.0000 | ORAL_TABLET | Freq: Once | ORAL | Status: AC
  Administered 2015-03-15: 1 via ORAL
  Filled 2015-03-15: qty 1

## 2015-03-15 MED ORDER — HYDROCORTISONE 2.5 % RE CREA: TOPICAL_CREAM | RECTAL | Status: DC

## 2015-03-15 NOTE — Discharge Instructions (Signed)

## 2015-03-15 NOTE — ED Notes (Signed)
MD at bedside. 

## 2015-03-15 NOTE — ED Notes (Signed)
Pt reports she had issues with bowel movents today, had to disimpact herself, afterwards she developed severe pain in rectum

## 2015-03-15 NOTE — ED Provider Notes (Signed)
CSN: [id number]     Arrival date & time 03/15/15  2056 History  This chart was scribed for Blanchie Dessert, MD by Evelene Croon, ED Scribe. This patient was seen in room MH10/MH10 and the patient's care was started 9:19 PM.    Chief Complaint  Patient presents with  . Abdominal Pain    The history is provided by the patient. No language interpreter was used.     HPI Comments:  Stacey Weeks is a 39 y.o. female who presents to the Emergency Department complaining of constant mid lower abdominal pain that started this evening. Pt notes she has a h/o chronic abdominal pain to the sides of her abdomen but pain tonight is different. She reports associated dysuria that also started today and constipation; she states she needed to disimpact herself and noticed two knots to the area. She denies fever, vomitng  and vaginal discharge. No alleviating factors noted. Pt states she had kidney surgery in March 2016 where she required stents by urology for hydronephrosis. She had a urinalysis on 4/25 during a follow up and was told there was still microscopic hematuria and that she had a bacterial infection and that they wanted to wait for the urine culture. Pt was not placed on abx during that visit and she denies obvious hematuria in her urine.  Pt notes h/o Hysterectomy in 2012.  The pain in her sides has been ongoing since surgery and that pain is not different today.    Past Medical History  Diagnosis Date  . Fibroid, uterus   . Chronic pelvic pain in female   . Anemia   . Blood transfusion 2000  . Active smoker   . Endometriosis   . Headache(784.0)   . Blood transfusion without reported diagnosis    Past Surgical History  Procedure Laterality Date  . Hysteroscopy w/d&c  10-26-10    polypectomy  . Ganglion cyst excision  age 59    right wrist  . Laparoscopy  10/01/2011    Procedure: LAPAROSCOPY DIAGNOSTIC;  Surgeon: Margarette Asal;  Location: [facility];  Service:  Gynecology;  Laterality: N/A;  . Abdominal hysterectomy  12/11/2011    Procedure: HYSTERECTOMY ABDOMINAL;  Surgeon: Margarette Asal, MD;  Location: [facility];  Service: Gynecology;  Laterality: N/A;  . Salpingoophorectomy  12/11/2011    Procedure: SALPINGO OOPHERECTOMY;  Surgeon: Margarette Asal, MD;  Location: [facility];  Service: Gynecology;  Laterality: Bilateral;  . Cystoscopy     Family History  Problem Relation Age of Onset  . Diabetes Maternal Grandmother   . Hypertension Maternal Grandmother   . Heart disease Maternal Grandmother    History  Substance Use Topics  . Smoking status: Current Every Day Smoker -- 0.50 packs/day for 6 years    Types: Cigarettes  . Smokeless tobacco: Never Used  . Alcohol Use: No   OB History    Gravida Para Term Preterm AB TAB SAB Ectopic Multiple Living   0              Review of Systems  Constitutional: Negative for fever.  Gastrointestinal: Positive for abdominal pain and constipation. Negative for vomiting.  Genitourinary: Negative for hematuria and vaginal discharge.  All other systems reviewed and are negative.     Allergies  Percocet  Home Medications   Prior to Admission medications   Not on File   BP 140/90 mmHg  Pulse 95  Temp(Src) 98.6 F (37 C) (Oral)  Resp 20  Ht 5\' 7"  (1.702 m)  Wt 228 lb (103.42 kg)  BMI 35.70 kg/m2  SpO2 100%  LMP 12/14/2010 Physical Exam  Constitutional: She is oriented to person, place, and time. She appears well-developed and well-nourished. No distress.  HENT:  Head: Normocephalic and atraumatic.  Eyes: Conjunctivae are normal.  Cardiovascular: Normal rate.   Pulmonary/Chest: Effort normal.  Abdominal: Soft. She exhibits no distension. There is tenderness.  Suprapubic TTP   Genitourinary:     No rectal fluctuance, drainage, or concern for perirectal abscess Bilateral CVA TTP Rectal small non thrombosed hemorrhoid that is tender to palpation Anal fissure noted   Chaperone was  present for exam which was performed with no discomfort or complications.    Neurological: She is alert and oriented to person, place, and time.  Skin: Skin is warm and dry.  Psychiatric: She has a normal mood and affect.  Nursing note and vitals reviewed.   ED Course  Procedures   DIAGNOSTIC STUDIES:  Oxygen Saturation is 100% on RA, normal by my interpretation.    COORDINATION OF CARE:  9:30 PM Discussed treatment plan with pt at bedside and pt agreed to plan.  Labs Review Labs Reviewed  URINALYSIS, ROUTINE W REFLEX MICROSCOPIC - Abnormal; Notable for the following:    APPearance CLOUDY (*)    Hgb urine dipstick SMALL (*)    All other components within normal limits  URINE MICROSCOPIC-ADD ON - Abnormal; Notable for the following:    Squamous Epithelial / LPF FEW (*)    Bacteria, UA MANY (*)    All other components within normal limits  URINE CULTURE    Imaging Review No results found.   EKG Interpretation None      MDM   Final diagnoses:  Constipation, unspecified constipation type  Hemorrhoids, unspecified hemorrhoid type  Dysuria    Patient with a history of chronic abdominal pain with recent surgery 2 months ago for hydronephrosis and stent placement. Patient has ongoing pain to the sides of her abdomen but states she came today for suprapubic tenderness, dysuria that started today as well as constipation resulting in rectal pain. She had to disimpact herself earlier tonight but denies any bleeding but the stool. On exam patient has a tender hemorrhoid that is nonthrombosed and an anal tear. At this time there is no evidence of perirectal abscess or concern for infection. Patient denies any vaginal discharge and had a hysterectomy several years ago and denies any vaginal bleeding.  Patient denies any symptoms concerning for pyelonephritis that she has no new back pain, fever, vomiting. UA today without evidence of infection but cultures sent. Discussed findings  with patient and the need for follow-up with her urologist who she saw on Monday and has a return visit scheduled. She was given pyridium and pain control as well as anusol for hemorrhoids   I personally performed the services described in this documentation, which was scribed in my presence.  The recorded information has been reviewed and considered.     Blanchie Dessert, MD 03/15/15 2330

## 2015-03-17 LAB — URINE CULTURE: Colony Count: 65000

## 2015-03-28 DIAGNOSIS — Z90710 Acquired absence of both cervix and uterus: Secondary | ICD-10-CM | POA: Insufficient documentation

## 2015-06-06 DIAGNOSIS — M5030 Other cervical disc degeneration, unspecified cervical region: Secondary | ICD-10-CM | POA: Insufficient documentation

## 2015-06-06 DIAGNOSIS — M545 Low back pain, unspecified: Secondary | ICD-10-CM | POA: Insufficient documentation

## 2015-06-06 DIAGNOSIS — M2550 Pain in unspecified joint: Secondary | ICD-10-CM | POA: Insufficient documentation

## 2015-06-06 DIAGNOSIS — M542 Cervicalgia: Secondary | ICD-10-CM | POA: Insufficient documentation

## 2015-06-06 DIAGNOSIS — G8929 Other chronic pain: Secondary | ICD-10-CM | POA: Insufficient documentation

## 2015-08-08 ENCOUNTER — Emergency Department (HOSPITAL_BASED_OUTPATIENT_CLINIC_OR_DEPARTMENT_OTHER)
Admission: EM | Admit: 2015-08-08 | Discharge: 2015-08-08 | Disposition: A | Discharge status: Home / Self Care | Payer: BLUE CROSS/BLUE SHIELD | Attending: Emergency Medicine | Admitting: Emergency Medicine

## 2015-08-08 ENCOUNTER — Encounter (HOSPITAL_BASED_OUTPATIENT_CLINIC_OR_DEPARTMENT_OTHER): Payer: Self-pay

## 2015-08-08 DIAGNOSIS — Z720 Tobacco use: Secondary | ICD-10-CM | POA: Diagnosis not present

## 2015-08-08 DIAGNOSIS — Z7952 Long term (current) use of systemic steroids: Secondary | ICD-10-CM | POA: Insufficient documentation

## 2015-08-08 DIAGNOSIS — Z86018 Personal history of other benign neoplasm: Secondary | ICD-10-CM | POA: Insufficient documentation

## 2015-08-08 DIAGNOSIS — Z79899 Other long term (current) drug therapy: Secondary | ICD-10-CM | POA: Insufficient documentation

## 2015-08-08 DIAGNOSIS — Z862 Personal history of diseases of the blood and blood-forming organs and certain disorders involving the immune mechanism: Secondary | ICD-10-CM | POA: Diagnosis not present

## 2015-08-08 DIAGNOSIS — N764 Abscess of vulva: Secondary | ICD-10-CM | POA: Diagnosis present

## 2015-08-08 DIAGNOSIS — L739 Follicular disorder, unspecified: Secondary | ICD-10-CM | POA: Insufficient documentation

## 2015-08-08 DIAGNOSIS — G8929 Other chronic pain: Secondary | ICD-10-CM | POA: Diagnosis not present

## 2015-08-08 MED ORDER — SULFAMETHOXAZOLE-TRIMETHOPRIM 800-160 MG PO TABS
1.0000 | ORAL_TABLET | Freq: Once | ORAL | Status: AC
  Administered 2015-08-08: 1 via ORAL
  Filled 2015-08-08: qty 1

## 2015-08-08 MED ORDER — SULFAMETHOXAZOLE-TRIMETHOPRIM 800-160 MG PO TABS: 1.0000 | ORAL_TABLET | Freq: Two times a day (BID) | ORAL | Status: AC

## 2015-08-08 NOTE — ED Notes (Signed)
Pt c/o three "bumps" to vaginal area with pain and itching

## 2015-08-08 NOTE — ED Provider Notes (Signed)
TIME SEEN: 4:10 AM  CHIEF COMPLAINT: Rash  HPI: Pt is a 39 y.o. female with no significant past history who presents emergency department with several bumps to the mons, external labia and inner thighs. States the bumps are itching and painful. No drainage. No new soaps, lotions, detergents.  She did recently start taking estradiol.  No fever. No hives. Denies vaginal bleeding or discharge. She is status post hysterectomy. No dysuria or hematuria.  ROS: See HPI Constitutional: no fever  Eyes: no drainage  ENT: no runny nose   Cardiovascular:  no chest pain  Resp: no SOB  GI: no vomiting GU: no dysuria Integumentary: no rash  Allergy: no hives  Musculoskeletal: no leg swelling  Neurological: no slurred speech ROS otherwise negative  PAST MEDICAL HISTORY/PAST SURGICAL HISTORY:  Past Medical History  Diagnosis Date  . Fibroid, uterus   . Chronic pelvic pain in female   . Anemia   . Blood transfusion 2000  . Active smoker   . Endometriosis   . Headache(784.0)   . Blood transfusion without reported diagnosis     MEDICATIONS:  Prior to Admission medications   Medication Sig Start Date End Date Taking? Authorizing Provider  hydrocortisone (ANUSOL-HC) 2.5 % rectal cream Apply rectally 2 times daily 03/15/15   Blanchie Dessert, MD  phenazopyridine (PYRIDIUM) 200 MG tablet Take 1 tablet (200 mg total) by mouth 3 (three) times daily. 03/15/15   Blanchie Dessert, MD    ALLERGIES:  Allergies  Allergen Reactions  . Percocet [Oxycodone-Acetaminophen] Itching and Other (See Comments)    Itching in mouth; lightheadedness - pt states she can take it with benadryl     SOCIAL HISTORY:  Social History  Substance Use Topics  . Smoking status: Current Every Day Smoker -- 0.50 packs/day for 6 years    Types: Cigarettes  . Smokeless tobacco: Never Used  . Alcohol Use: No    FAMILY HISTORY: Family History  Problem Relation Age of Onset  . Diabetes Maternal Grandmother   . Hypertension  Maternal Grandmother   . Heart disease Maternal Grandmother     EXAM: BP 142/71 mmHg  Pulse 87  Temp(Src) 97.8 F (36.6 C) (Oral)  SpO2 100%  LMP 12/14/2010 CONSTITUTIONAL: Alert and oriented and responds appropriately to questions. Well-appearing; well-nourished HEAD: Normocephalic EYES: Conjunctivae clear, PERRL ENT: normal nose; no rhinorrhea; moist mucous membranes; pharynx without lesions noted NECK: Supple, no meningismus, no LAD  CARD: RRR; S1 and S2 appreciated; no murmurs, no clicks, no rubs, no gallops RESP: Normal chest excursion without splinting or tachypnea; breath sounds clear and equal bilaterally; no wheezes, no rhonchi, no rales, no hypoxia or respiratory distress, speaking full sentences ABD/GI: Normal bowel sounds; non-distended; soft, non-tender, no rebound, no guarding, no peritoneal signs GU:  Patient has several small papular areas noted to the mons and external labia majora and inner thighs consistent with folliculitis without drainage or surrounding cellulitis, no abscess, no vaginal discharge or bleeding, no vesicular lesions BACK:  The back appears normal and is non-tender to palpation, there is no CVA tenderness EXT: Normal ROM in all joints; non-tender to palpation; no edema; normal capillary refill; no cyanosis, no calf tenderness or swelling    SKIN: Normal color for age and race; warm, no hives, no petechia or purpura, no desquamation or blisters, no rash involving the palms, soles or mucous membranes NEURO: Moves all extremities equally, sensation to light touch intact diffusely, cranial nerves II through XII intact PSYCH: The patient's mood and manner  are appropriate. Grooming and personal hygiene are appropriate.  MEDICAL DECISION MAKING: Pt ere with folliculitis. No abscess for drainage. Will discharge on Bactrim. States she has been shaving her genital area. Have advised her to stop doing this as this is likely causing her folliculitis. Have recommended  Benadryl as needed for itching over-the-counter. Discussed return precautions. Discussed using warm baths and warm compresses. She verbalized understanding and is comfortable with this plan.       Rayne, DO 08/08/15 [phone]

## 2015-08-08 NOTE — Discharge Instructions (Signed)

## 2015-09-13 DIAGNOSIS — F17200 Nicotine dependence, unspecified, uncomplicated: Secondary | ICD-10-CM | POA: Insufficient documentation

## 2016-01-30 IMAGING — CR DG CHEST 2V
2 series · 2 of 2 positions shown · non-contrast
Comparison: DG CHEST 2 VIEW dated 08/17/2011

CLINICAL DATA: Cough, nausea and chills.

EXAM:
CHEST - 2 VIEW

[w chest pa]
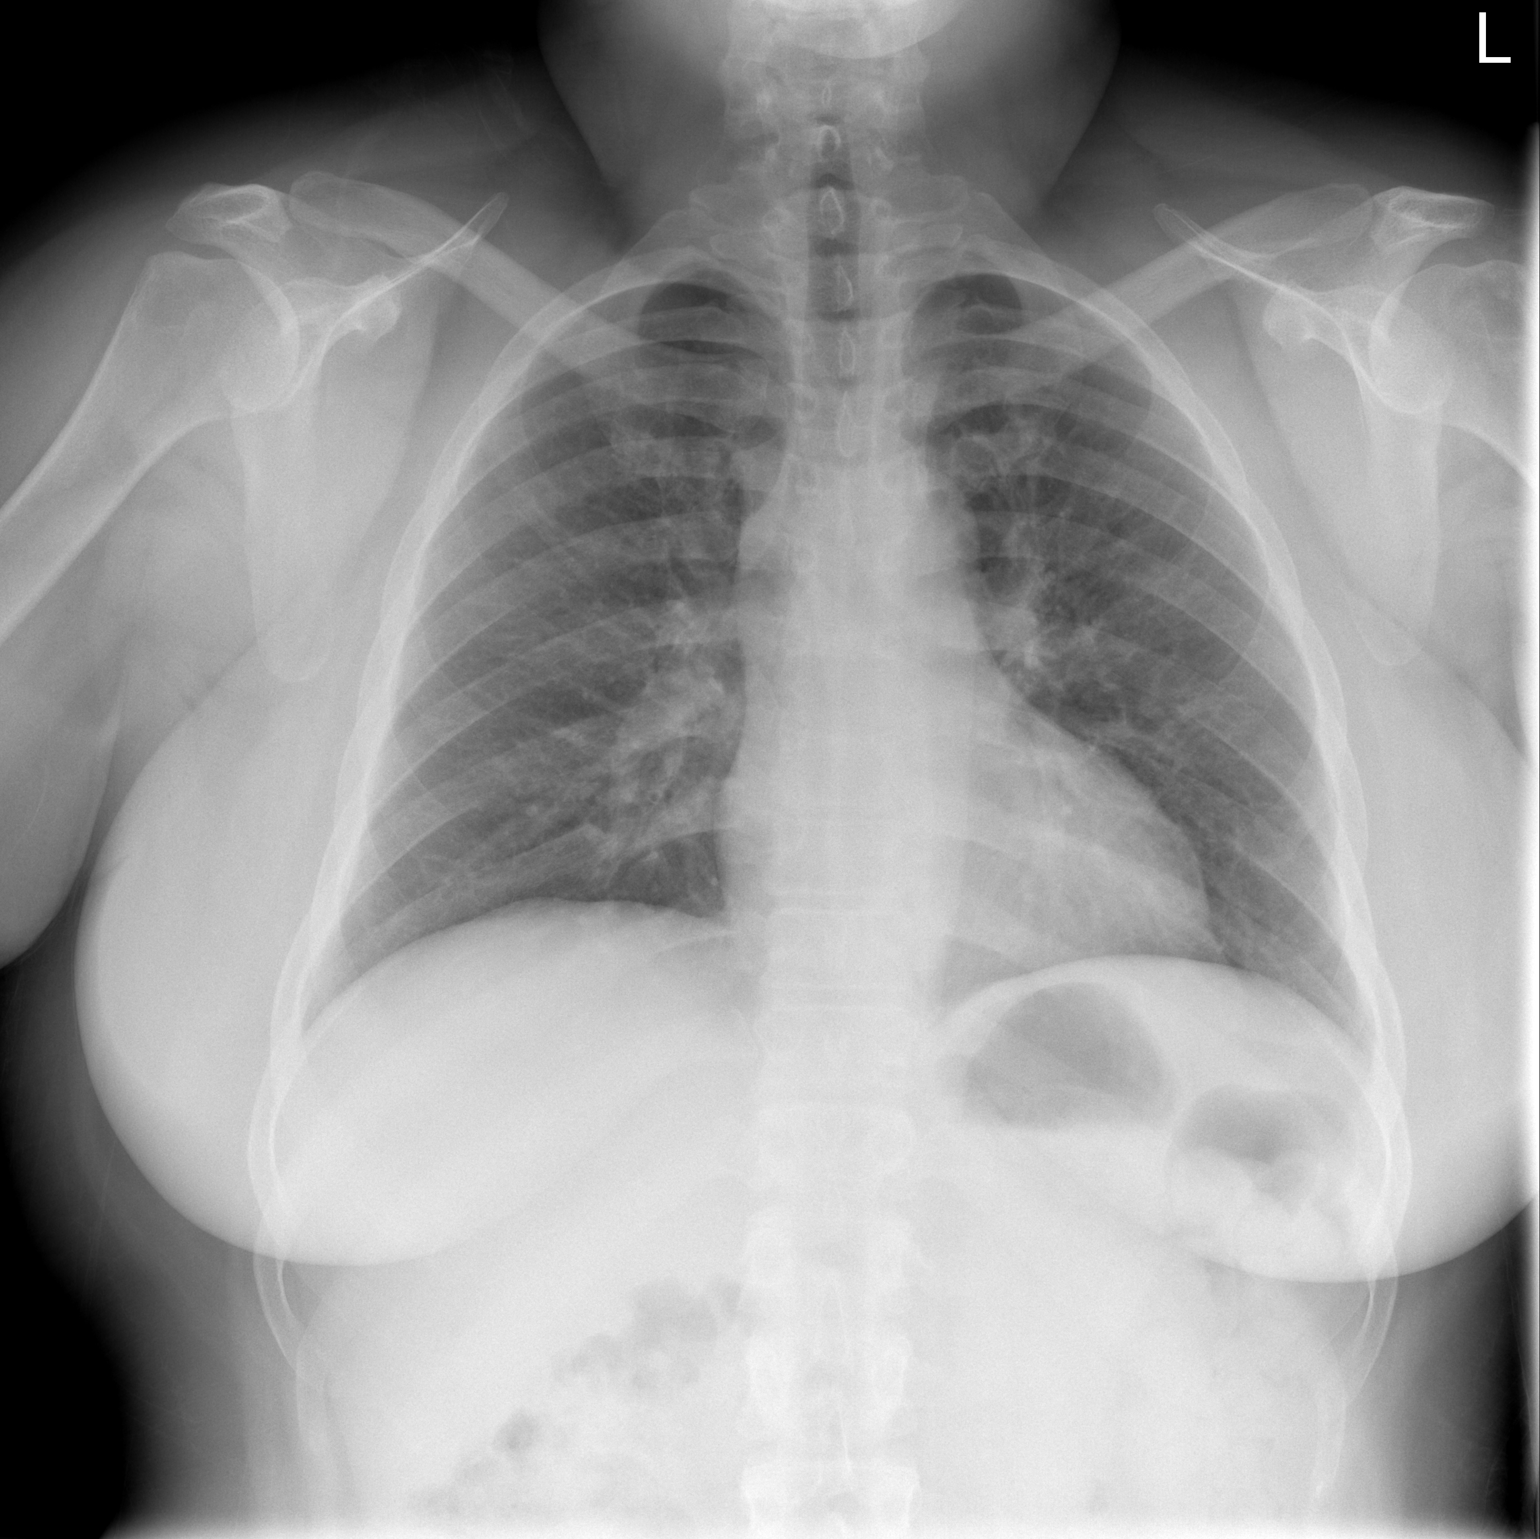

[w chest lat]
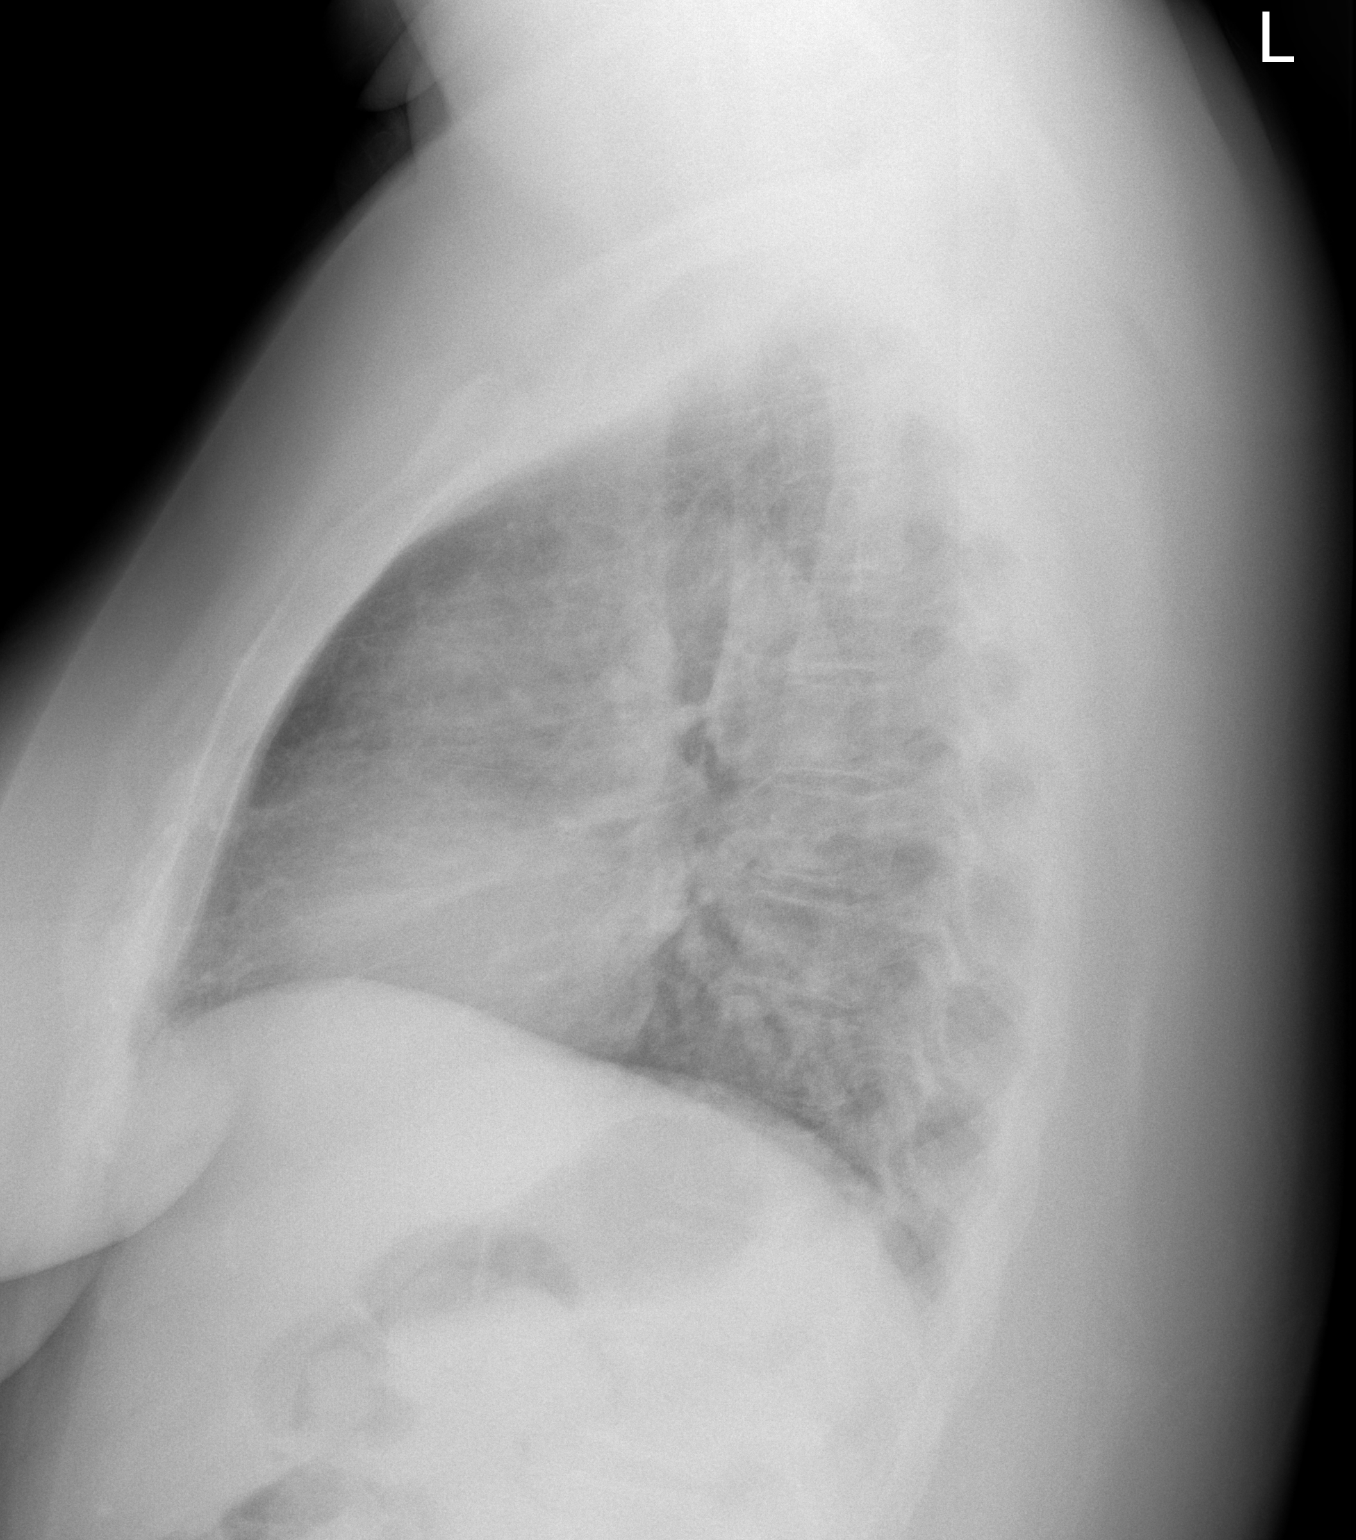

[2 of 2 positions shown; findings below may reference images not displayed]

FINDINGS: The heart size and mediastinal contours are within normal limits.
There is no evidence of pulmonary edema, consolidation,
pneumothorax, nodule or pleural fluid. The visualized skeletal
structures are unremarkable.
IMPRESSION: No active disease.

## 2016-08-01 ENCOUNTER — Ambulatory Visit (HOSPITAL_BASED_OUTPATIENT_CLINIC_OR_DEPARTMENT_OTHER): Admit: 2016-08-01 | Payer: BLUE CROSS/BLUE SHIELD | Admitting: Orthopedic Surgery

## 2016-08-01 ENCOUNTER — Encounter (HOSPITAL_BASED_OUTPATIENT_CLINIC_OR_DEPARTMENT_OTHER): Payer: Self-pay | Admitting: Adult Health

## 2016-08-01 ENCOUNTER — Emergency Department (HOSPITAL_BASED_OUTPATIENT_CLINIC_OR_DEPARTMENT_OTHER): Payer: Self-pay | Admitting: Certified Registered"

## 2016-08-01 ENCOUNTER — Emergency Department (HOSPITAL_BASED_OUTPATIENT_CLINIC_OR_DEPARTMENT_OTHER): Payer: Self-pay

## 2016-08-01 ENCOUNTER — Ambulatory Visit (HOSPITAL_BASED_OUTPATIENT_CLINIC_OR_DEPARTMENT_OTHER)
Admission: RE | Admit: 2016-08-01 | Discharge: 2016-08-01 | Disposition: A | Discharge status: Home / Self Care | Payer: Self-pay | Source: Ambulatory Visit | Attending: Orthopedic Surgery | Admitting: Orthopedic Surgery

## 2016-08-01 ENCOUNTER — Encounter (HOSPITAL_BASED_OUTPATIENT_CLINIC_OR_DEPARTMENT_OTHER): Admission: RE | Disposition: A | Discharge status: Home / Self Care | Payer: Self-pay | Source: Ambulatory Visit

## 2016-08-01 DIAGNOSIS — F17210 Nicotine dependence, cigarettes, uncomplicated: Secondary | ICD-10-CM | POA: Insufficient documentation

## 2016-08-01 DIAGNOSIS — S62637B Displaced fracture of distal phalanx of left little finger, initial encounter for open fracture: Secondary | ICD-10-CM

## 2016-08-01 DIAGNOSIS — S62631B Displaced fracture of distal phalanx of left index finger, initial encounter for open fracture: Secondary | ICD-10-CM | POA: Insufficient documentation

## 2016-08-01 DIAGNOSIS — S61311A Laceration without foreign body of left index finger with damage to nail, initial encounter: Secondary | ICD-10-CM | POA: Insufficient documentation

## 2016-08-01 DIAGNOSIS — S62639B Displaced fracture of distal phalanx of unspecified finger, initial encounter for open fracture: Secondary | ICD-10-CM

## 2016-08-01 DIAGNOSIS — W312XXA Contact with powered woodworking and forming machines, initial encounter: Secondary | ICD-10-CM | POA: Insufficient documentation

## 2016-08-01 HISTORY — PX: INCISION AND DRAINAGE OF WOUND: SHX1803

## 2016-08-01 LAB — BASIC METABOLIC PANEL
ANION GAP: 9 (ref 5–15)
BUN: 13 mg/dL (ref 6–20)
CALCIUM: 9.6 mg/dL (ref 8.9–10.3)
CO2: 24 mmol/L (ref 22–32)
Chloride: 106 mmol/L (ref 101–111)
Creatinine, Ser: 0.77 mg/dL (ref 0.44–1.00)
GFR calc Af Amer: >60 mL/min (ref >60)
Glucose, Bld: 120 mg/dL — ABNORMAL HIGH (ref 65–99)
Potassium: 3.2 mmol/L — ABNORMAL LOW (ref 3.5–5.1)
SODIUM: 139 mmol/L (ref 135–145)

## 2016-08-01 LAB — CBC
HCT: 37.4 % (ref 36.0–46.0)
Hemoglobin: 12.7 g/dL (ref 12.0–15.0)
MCH: 28.8 pg (ref 26.0–34.0)
MCHC: 34 g/dL (ref 30.0–36.0)
MCV: 84.8 fL (ref 78.0–100.0)
PLATELETS: 392 10*3/uL (ref 150–400)
RBC: 4.41 MIL/uL (ref 3.87–5.11)
RDW: 13.3 % (ref 11.5–15.5)
WBC: 9.5 10*3/uL (ref 4.0–10.5)

## 2016-08-01 SURGERY — IRRIGATION AND DEBRIDEMENT WOUND
Anesthesia: General | Site: Finger | Laterality: Left

## 2016-08-01 MED ORDER — OXYCODONE HCL 5 MG/5ML PO SOLN: 5.0000 mg | Freq: Once | ORAL | Status: DC | PRN

## 2016-08-01 MED ORDER — HYDROMORPHONE HCL 1 MG/ML IJ SOLN: 1.0000 mg | Freq: Once | INTRAMUSCULAR | Status: DC

## 2016-08-01 MED ORDER — ONDANSETRON HCL 4 MG/2ML IJ SOLN
INTRAMUSCULAR | Status: DC | PRN
  Administered 2016-08-01: 4 mg via INTRAVENOUS

## 2016-08-01 MED ORDER — MEPERIDINE HCL 25 MG/ML IJ SOLN: 6.2500 mg | INTRAMUSCULAR | Status: DC | PRN

## 2016-08-01 MED ORDER — DEXAMETHASONE SODIUM PHOSPHATE 10 MG/ML IJ SOLN
INTRAMUSCULAR | Status: AC
  Filled 2016-08-01: qty 1

## 2016-08-01 MED ORDER — LIDOCAINE 2% (20 MG/ML) 5 ML SYRINGE
INTRAMUSCULAR | Status: AC
  Filled 2016-08-01: qty 5

## 2016-08-01 MED ORDER — LIDOCAINE HCL 1 % IJ SOLN
INTRAMUSCULAR | Status: DC
Start: 2016-08-01 — End: 2016-08-01
  Filled 2016-08-01: qty 20

## 2016-08-01 MED ORDER — ONDANSETRON HCL 4 MG/2ML IJ SOLN
INTRAMUSCULAR | Status: AC
  Filled 2016-08-01: qty 2

## 2016-08-01 MED ORDER — FENTANYL CITRATE (PF) 100 MCG/2ML IJ SOLN
75.0000 ug | Freq: Once | INTRAMUSCULAR | Status: AC
  Administered 2016-08-01: 75 ug via INTRAVENOUS

## 2016-08-01 MED ORDER — POTASSIUM CHLORIDE CRYS ER 20 MEQ PO TBCR
40.0000 meq | EXTENDED_RELEASE_TABLET | Freq: Once | ORAL | Status: AC
  Administered 2016-08-01: 40 meq via ORAL
  Filled 2016-08-01: qty 2

## 2016-08-01 MED ORDER — TETANUS-DIPHTH-ACELL PERTUSSIS 5-2.5-18.5 LF-MCG/0.5 IM SUSP
0.5000 mL | Freq: Once | INTRAMUSCULAR | Status: AC
  Administered 2016-08-01: 0.5 mL via INTRAMUSCULAR
  Filled 2016-08-01: qty 0.5

## 2016-08-01 MED ORDER — HYDROMORPHONE HCL 1 MG/ML IJ SOLN
0.2500 mg | INTRAMUSCULAR | Status: DC | PRN
  Administered 2016-08-01: 0.5 mg via INTRAVENOUS

## 2016-08-01 MED ORDER — OXYCODONE HCL 5 MG PO TABS: 5.0000 mg | ORAL_TABLET | Freq: Once | ORAL | Status: DC | PRN

## 2016-08-01 MED ORDER — PROMETHAZINE HCL 25 MG/ML IJ SOLN: 6.2500 mg | INTRAMUSCULAR | Status: DC | PRN

## 2016-08-01 MED ORDER — HYDROMORPHONE HCL 1 MG/ML IJ SOLN
INTRAMUSCULAR | Status: AC
  Administered 2016-08-01: 0.5 mg via INTRAVENOUS
  Filled 2016-08-01: qty 1

## 2016-08-01 MED ORDER — LIDOCAINE HCL 2 % IJ SOLN
20.0000 mL | Freq: Once | INTRAMUSCULAR | Status: AC
  Administered 2016-08-01: 400 mg

## 2016-08-01 MED ORDER — FENTANYL CITRATE (PF) 100 MCG/2ML IJ SOLN
INTRAMUSCULAR | Status: AC
  Filled 2016-08-01: qty 2

## 2016-08-01 MED ORDER — PROPOFOL 10 MG/ML IV BOLUS
INTRAVENOUS | Status: DC | PRN
Start: 2016-08-01 — End: 2016-08-01
  Administered 2016-08-01: 180 mg via INTRAVENOUS

## 2016-08-01 MED ORDER — SULFAMETHOXAZOLE-TRIMETHOPRIM 800-160 MG PO TABS: 1.0000 | ORAL_TABLET | Freq: Two times a day (BID) | ORAL | 0 refills | Status: DC

## 2016-08-01 MED ORDER — MIDAZOLAM HCL 5 MG/5ML IJ SOLN
INTRAMUSCULAR | Status: DC | PRN
  Administered 2016-08-01: 2 mg via INTRAVENOUS

## 2016-08-01 MED ORDER — HYDROMORPHONE HCL 1 MG/ML IJ SOLN
0.5000 mg | Freq: Once | INTRAMUSCULAR | Status: AC
  Administered 2016-08-01: 0.5 mg via INTRAVENOUS

## 2016-08-01 MED ORDER — MIDAZOLAM HCL 2 MG/2ML IJ SOLN
INTRAMUSCULAR | Status: AC
  Filled 2016-08-01: qty 2

## 2016-08-01 MED ORDER — TETANUS-DIPHTH-ACELL PERTUSSIS 5-2.5-18.5 LF-MCG/0.5 IM SUSP
INTRAMUSCULAR | Status: AC
  Filled 2016-08-01: qty 0.5

## 2016-08-01 MED ORDER — LACTATED RINGERS IV SOLN: INTRAVENOUS | Status: DC

## 2016-08-01 MED ORDER — CEFAZOLIN IN D5W 1 GM/50ML IV SOLN
INTRAVENOUS | Status: AC
  Administered 2016-08-01: 1 g via INTRAVENOUS
  Filled 2016-08-01: qty 50

## 2016-08-01 MED ORDER — OXYCODONE HCL 5 MG PO TABS
5.0000 mg | ORAL_TABLET | Freq: Once | ORAL | Status: DC | PRN
Start: 2016-08-01 — End: 2016-08-01

## 2016-08-01 MED ORDER — GLYCOPYRROLATE 0.2 MG/ML IJ SOLN
INTRAMUSCULAR | Status: DC | PRN
  Administered 2016-08-01: 0.2 mg via INTRAVENOUS

## 2016-08-01 MED ORDER — LIDOCAINE HCL 2 % IJ SOLN
INTRAMUSCULAR | Status: AC
  Administered 2016-08-01: 400 mg
  Filled 2016-08-01: qty 20

## 2016-08-01 MED ORDER — LORAZEPAM 2 MG/ML IJ SOLN
2.0000 mg | Freq: Once | INTRAMUSCULAR | Status: AC
  Administered 2016-08-01: 2 mg via INTRAVENOUS
  Filled 2016-08-01: qty 1

## 2016-08-01 MED ORDER — DEXAMETHASONE SODIUM PHOSPHATE 10 MG/ML IJ SOLN
INTRAMUSCULAR | Status: DC | PRN
  Administered 2016-08-01: 10 mg via INTRAVENOUS

## 2016-08-01 MED ORDER — FENTANYL CITRATE (PF) 100 MCG/2ML IJ SOLN
25.0000 ug | Freq: Once | INTRAMUSCULAR | Status: AC
  Administered 2016-08-01: 25 ug via INTRAVENOUS

## 2016-08-01 MED ORDER — FENTANYL CITRATE (PF) 100 MCG/2ML IJ SOLN
INTRAMUSCULAR | Status: DC | PRN
  Administered 2016-08-01: 50 ug via INTRAVENOUS
  Administered 2016-08-01 (×2): 25 ug via INTRAVENOUS

## 2016-08-01 MED ORDER — CEFAZOLIN IN D5W 1 GM/50ML IV SOLN
1.0000 g | Freq: Once | INTRAVENOUS | Status: AC
  Administered 2016-08-01: 1 g via INTRAVENOUS

## 2016-08-01 MED ORDER — LIDOCAINE HCL (CARDIAC) 20 MG/ML IV SOLN
INTRAVENOUS | Status: DC | PRN
  Administered 2016-08-01: 60 mg via INTRAVENOUS

## 2016-08-01 MED ORDER — HYDROCODONE-ACETAMINOPHEN 5-325 MG PO TABS
ORAL_TABLET | ORAL | Status: AC
Start: 2016-08-01 — End: 2016-08-01
  Filled 2016-08-01: qty 1

## 2016-08-01 MED ORDER — BUPIVACAINE HCL (PF) 0.25 % IJ SOLN
INTRAMUSCULAR | Status: DC | PRN
  Administered 2016-08-01: 10 mL

## 2016-08-01 MED ORDER — HYDROCODONE-ACETAMINOPHEN 5-325 MG PO TABS: ORAL_TABLET | ORAL | 0 refills | Status: DC

## 2016-08-01 MED ORDER — HYDROMORPHONE HCL 1 MG/ML IJ SOLN: 0.2500 mg | INTRAMUSCULAR | Status: DC | PRN

## 2016-08-01 MED ORDER — CEFAZOLIN SODIUM-DEXTROSE 2-3 GM-% IV SOLR
INTRAVENOUS | Status: DC | PRN
  Administered 2016-08-01: 2 g via INTRAVENOUS

## 2016-08-01 MED ORDER — HYDROMORPHONE HCL 1 MG/ML IJ SOLN
INTRAMUSCULAR | Status: AC
Start: 2016-08-01 — End: 2016-08-01
  Filled 2016-08-01: qty 1

## 2016-08-01 MED ORDER — ONDANSETRON HCL 4 MG/2ML IJ SOLN
4.0000 mg | Freq: Once | INTRAMUSCULAR | Status: AC
  Administered 2016-08-01: 4 mg via INTRAVENOUS

## 2016-08-01 MED ORDER — LACTATED RINGERS IV SOLN
INTRAVENOUS | Status: DC | PRN
  Administered 2016-08-01 (×2): via INTRAVENOUS

## 2016-08-01 MED ORDER — HYDROCODONE-ACETAMINOPHEN 5-325 MG PO TABS
1.0000 | ORAL_TABLET | Freq: Once | ORAL | Status: AC
  Administered 2016-08-01: 1 via ORAL

## 2016-08-01 SURGICAL SUPPLY — 52 items
BAG DECANTER FOR FLEXI CONT (MISCELLANEOUS) IMPLANT
BANDAGE ACE 3X5.8 VEL STRL LF (GAUZE/BANDAGES/DRESSINGS) IMPLANT
BLADE MINI RND TIP GREEN BEAV (BLADE) IMPLANT
BLADE SURG 15 STRL LF DISP TIS (BLADE) ×4 IMPLANT
BLADE SURG 15 STRL SS (BLADE) ×4
BNDG COHESIVE 1X5 TAN STRL LF (GAUZE/BANDAGES/DRESSINGS) IMPLANT
BNDG ELASTIC 2X5.8 VLCR STR LF (GAUZE/BANDAGES/DRESSINGS) IMPLANT
BNDG ESMARK 4X9 LF (GAUZE/BANDAGES/DRESSINGS) IMPLANT
BNDG GAUZE 1X2.1 STRL (MISCELLANEOUS) IMPLANT
BNDG GAUZE ELAST 4 BULKY (GAUZE/BANDAGES/DRESSINGS) IMPLANT
BRUSH SCRUB EZ PLAIN DRY (MISCELLANEOUS) ×4 IMPLANT
CHLORAPREP W/TINT 26ML (MISCELLANEOUS) IMPLANT
CORDS BIPOLAR (ELECTRODE) ×4 IMPLANT
COVER BACK TABLE 60X90IN (DRAPES) ×4 IMPLANT
COVER MAYO STAND STRL (DRAPES) ×4 IMPLANT
CUFF TOURNIQUET SINGLE 18IN (TOURNIQUET CUFF) ×4 IMPLANT
DRAPE EXTREMITY T 121X128X90 (DRAPE) ×4 IMPLANT
DRAPE SURG 17X23 STRL (DRAPES) ×4 IMPLANT
GAUZE PACKING IODOFORM 1/4X15 (GAUZE/BANDAGES/DRESSINGS) IMPLANT
GAUZE SPONGE 4X4 12PLY STRL (GAUZE/BANDAGES/DRESSINGS) ×4 IMPLANT
GAUZE XEROFORM 1X8 LF (GAUZE/BANDAGES/DRESSINGS) ×4 IMPLANT
GLOVE BIO SURGEON STRL SZ 6.5 (GLOVE) ×3 IMPLANT
GLOVE BIO SURGEON STRL SZ7.5 (GLOVE) ×4 IMPLANT
GLOVE BIO SURGEONS STRL SZ 6.5 (GLOVE) ×1
GLOVE BIOGEL M STRL SZ7.5 (GLOVE) ×4 IMPLANT
GLOVE BIOGEL PI IND STRL 8 (GLOVE) ×4 IMPLANT
GLOVE BIOGEL PI INDICATOR 8 (GLOVE) ×4
GOWN STRL REUS W/ TWL LRG LVL3 (GOWN DISPOSABLE) ×2 IMPLANT
GOWN STRL REUS W/TWL LRG LVL3 (GOWN DISPOSABLE) ×2
GOWN STRL REUS W/TWL XL LVL3 (GOWN DISPOSABLE) ×4 IMPLANT
LOOP VESSEL MAXI BLUE (MISCELLANEOUS) IMPLANT
NEEDLE HYPO 25X1 1.5 SAFETY (NEEDLE) ×4 IMPLANT
NS IRRIG 1000ML POUR BTL (IV SOLUTION) ×4 IMPLANT
PACK BASIN DAY SURGERY FS (CUSTOM PROCEDURE TRAY) ×4 IMPLANT
PAD CAST 3X4 CTTN HI CHSV (CAST SUPPLIES) IMPLANT
PADDING CAST ABS 4INX4YD NS (CAST SUPPLIES) ×2
PADDING CAST ABS COTTON 4X4 ST (CAST SUPPLIES) ×2 IMPLANT
PADDING CAST COTTON 3X4 STRL (CAST SUPPLIES)
SPLINT FINGER 3.25 BULB 911905 (SOFTGOODS) ×4 IMPLANT
SPLINT PLASTER CAST XFAST 3X15 (CAST SUPPLIES) IMPLANT
SPLINT PLASTER XTRA FASTSET 3X (CAST SUPPLIES)
STOCKINETTE 4X48 STRL (DRAPES) ×4 IMPLANT
SUT CHROMIC 6 0 G 1 (SUTURE) ×4 IMPLANT
SUT ETHILON 4 0 PS 2 18 (SUTURE) IMPLANT
SUT MON AB 5-0 PS2 18 (SUTURE) ×4 IMPLANT
SWAB COLLECTION DEVICE MRSA (MISCELLANEOUS) IMPLANT
SWAB CULTURE ESWAB REG 1ML (MISCELLANEOUS) IMPLANT
SYR BULB 3OZ (MISCELLANEOUS) ×4 IMPLANT
SYR CONTROL 10ML LL (SYRINGE) ×4 IMPLANT
TOWEL OR 17X24 6PK STRL BLUE (TOWEL DISPOSABLE) ×8 IMPLANT
TUBE FEEDING 5FR 15 INCH (TUBING) IMPLANT
UNDERPAD 30X30 (UNDERPADS AND DIAPERS) ×4 IMPLANT

## 2016-08-01 NOTE — ED Notes (Signed)
ED-PA Elmyra Ricks confirmed that patient still needed to be administered PO potassium prior to leaving this facility. Patient ingested 2 tablets with approximately 29ml of water.

## 2016-08-01 NOTE — ED Notes (Signed)
Patient transported to X-ray 

## 2016-08-01 NOTE — ED Provider Notes (Signed)
[facility] Provider Note   CSN: [id number] Arrival date & time: 08/01/16  1051     History   Chief Complaint Chief Complaint  Patient presents with  . Finger Injury    HPI  Blood pressure 135/94, pulse 99, temperature 97.9 F (36.6 C), temperature source Oral, resp. rate 22, last menstrual period 12/14/2010, SpO2 100 %.  MARIANITA KIRCH is a 40 y.o. female complaining of laceration to left second digit occurring just prior to arrival while patient was working on a table saw at home while doing renovations to her bathroom. Patient is right-hand-dominant. Pain is severe, bleeding has been nonpulsatile and easy to control. Last tetanus shot is unknown. Last oral intake was Dr. Malachi Bonds approximately 30 minutes prior to arrival and her last solid food intake was last evening.   HPI  Past Medical History:  Diagnosis Date  . Active smoker   . Anemia   . Blood transfusion 2000  . Blood transfusion without reported diagnosis   . Chronic pelvic pain in female   . Endometriosis   . Fibroid, uterus   . Headache(784.0)     There are no active problems to display for this patient.   Past Surgical History:  Procedure Laterality Date  . ABDOMINAL HYSTERECTOMY  12/11/2011   Procedure: HYSTERECTOMY ABDOMINAL;  Surgeon: Margarette Asal, MD;  Location: [facility];  Service: Gynecology;  Laterality: N/A;  . CYSTOSCOPY    . GANGLION CYST EXCISION  age 56   right wrist  . HYSTEROSCOPY W/D&C  10-26-10   polypectomy  . LAPAROSCOPY  10/01/2011   Procedure: LAPAROSCOPY DIAGNOSTIC;  Surgeon: Margarette Asal;  Location: [facility];  Service: Gynecology;  Laterality: N/A;  . SALPINGOOPHORECTOMY  12/11/2011   Procedure: SALPINGO OOPHERECTOMY;  Surgeon: Margarette Asal, MD;  Location: [facility];  Service: Gynecology;  Laterality: Bilateral;    OB History    Gravida Para Term Preterm AB Living   0             SAB TAB Ectopic Multiple Live Births                    Home Medications    Prior to Admission medications   Medication Sig Start Date End Date Taking? Authorizing Provider  hydrocortisone (ANUSOL-HC) 2.5 % rectal cream Apply rectally 2 times daily 03/15/15   Blanchie Dessert, MD  phenazopyridine (PYRIDIUM) 200 MG tablet Take 1 tablet (200 mg total) by mouth 3 (three) times daily. 03/15/15   Blanchie Dessert, MD    Family History Family History  Problem Relation Age of Onset  . Diabetes Maternal Grandmother   . Hypertension Maternal Grandmother   . Heart disease Maternal Grandmother     Social History Social History  Substance Use Topics  . Smoking status: Current Every Day Smoker    Packs/day: 0.50    Years: 6.00    Types: Cigarettes  . Smokeless tobacco: Never Used  . Alcohol use No     Allergies   Percocet [oxycodone-acetaminophen]   Review of Systems Review of Systems  10 systems reviewed and found to be negative, except as noted in the HPI.   Physical Exam Updated Vital Signs BP 132/82   Pulse 85   Temp 97.9 F (36.6 C) (Oral)   Resp 18   LMP 12/14/2010   SpO2 100%   Physical Exam  Constitutional: She is oriented to person, place, and time. She appears well-developed and well-nourished. No distress.  HENT:  Head: Normocephalic and atraumatic.  Mouth/Throat: Oropharynx is clear and moist.  Eyes: Conjunctivae and EOM are normal. Pupils are equal, round, and reactive to light.  Neck: Normal range of motion.  Cardiovascular: Normal rate, regular rhythm and intact distal pulses.   Pulmonary/Chest: Effort normal and breath sounds normal.  Abdominal: Soft. There is no tenderness.  Musculoskeletal: Normal range of motion.  Neurological: She is alert and oriented to person, place, and time.  Skin: She is not diaphoretic.  Left second digit with nail bed avulsion, bone exposed, nonpulsatile bleeding. Joint is not visible.  Psychiatric:  Agitated, anxious and yelling  Nursing note and vitals  reviewed.    ED Treatments / Results  Labs (all labs ordered are listed, but only abnormal results are displayed) Labs Reviewed  BASIC METABOLIC PANEL - Abnormal; Notable for the following:       Result Value   Potassium 3.2 (*)    Glucose, Bld 120 (*)    All other components within normal limits  CBC    EKG  EKG Interpretation None       Radiology Dg Hand Complete Left  Result Date: 08/01/2016 CLINICAL DATA:  Cut tip of finger in saw EXAM: LEFT HAND - COMPLETE 3+ VIEW COMPARISON:  None FINDINGS: There is a comminuted fracture deformity involving the distal shaft and tuft of the second distal phalanx. Intra-articular fracture is noted involving the medial base of the second distal phalanx. Overlying soft tissue laceration is identified. IMPRESSION: 1. Comminuted fracture deformities involve the proximal and distal aspect of the second distal phalanx. Electronically Signed   By: Kerby Moors M.D.   On: 08/01/2016 11:54    Procedures .Nerve Block Date/Time: 08/01/2016 1:17 PM Performed by: Monico Blitz Authorized by: Monico Blitz   Consent:    Consent obtained:  Verbal Indications:    Indications:  Pain relief Location:    Body area:  Upper extremity   Laterality:  Left Pre-procedure details:    Skin preparation:  Povidone-iodine   Preparation: Patient was prepped and draped in usual sterile fashion   Procedure details (see MAR for exact dosages):    Block needle gauge:  24 G   Anesthetic injected:  Lidocaine 2% w/o epi   Injection procedure:  Anatomic landmarks identified Comments:     Wound is irrigated with 2 L sterile saline, nail approximated back into anatomic position and wound is dressed with Xeroform and cling.   (including critical care time)  Medications Ordered in ED Medications  lidocaine (XYLOCAINE) 1 % (with pres) injection (  Not Given 08/01/16 1202)  potassium chloride SA (K-DUR,KLOR-CON) CR tablet 40 mEq (not administered)   ondansetron (ZOFRAN) injection 4 mg (4 mg Intravenous Given 08/01/16 1112)  Tdap (BOOSTRIX) injection 0.5 mL (0.5 mLs Intramuscular Given 08/01/16 1145)  lidocaine (XYLOCAINE) 2 % (with pres) injection 400 mg (400 mg Infiltration Given by Other 08/01/16 1131)  HYDROmorphone (DILAUDID) injection 0.5 mg (0.5 mg Intravenous Given 08/01/16 1130)  ceFAZolin (ANCEF) IVPB 1 g/50 mL premix (0 g Intravenous Stopped 08/01/16 1302)  LORazepam (ATIVAN) injection 2 mg (2 mg Intravenous Given 08/01/16 1200)     Initial Impression / Assessment and Plan / ED Course  I have reviewed the triage vital signs and the nursing notes.  Pertinent labs & imaging results that were available during my care of the patient were reviewed by me and considered in my medical decision making (see chart for details).  Clinical Course    Vitals:  08/01/16 1101 08/01/16 1234 08/01/16 1253  BP: 135/94 128/76 132/82  Pulse: 99 86 85  Resp: 22 19 18   Temp: 97.9 F (36.6 C)    TempSrc: Oral    SpO2: 100% 100% 100%    Medications  lidocaine (XYLOCAINE) 1 % (with pres) injection (  Not Given 08/01/16 1202)  potassium chloride SA (K-DUR,KLOR-CON) CR tablet 40 mEq (not administered)  ondansetron (ZOFRAN) injection 4 mg (4 mg Intravenous Given 08/01/16 1112)  Tdap (BOOSTRIX) injection 0.5 mL (0.5 mLs Intramuscular Given 08/01/16 1145)  lidocaine (XYLOCAINE) 2 % (with pres) injection 400 mg (400 mg Infiltration Given by Other 08/01/16 1131)  HYDROmorphone (DILAUDID) injection 0.5 mg (0.5 mg Intravenous Given 08/01/16 1130)  ceFAZolin (ANCEF) IVPB 1 g/50 mL premix (0 g Intravenous Stopped 08/01/16 1302)  LORazepam (ATIVAN) injection 2 mg (2 mg Intravenous Given 08/01/16 1200)    Hayes Ludwig D Laplant is 40 y.o. female presenting withComplex laceration to left second distal phalanx with nailbed involvement. Nerve block given, wound is irrigated and skin reapproximated is a biologic dressing. X-ray with distal phalanx fracture. Patient's  tetanus is updated, Ancef is given, patient remains extremely agitated which has improved after Ativan. Advised her to remain nothing by mouth. Patient discussed with hand surgery AP P Legrand Como who works with Dr. Fredna Dow who accepts transfer to the [facility] outpatient surgery center. Patient's aunt will drive her there, IVs in place, reinforced with patient that she is to remain nothing by mouth. Blood work with a mild hypokalemia at 3.2, will replete with 40 mEq by mouth.  This is a shared visit with the attending physician who personally evaluated the patient and agrees with the care plan.   Patient has tried to secure a ride via private vehicle to the outpatient surgery center however she is unable to do so, care [facility] will transfer this patient.    Final Clinical Impressions(s) / ED Diagnoses   Final diagnoses:  Nail bed fracture of finger, open, initial encounter  Fracture of fifth finger, distal phalanx, left, open, initial encounter      Monico Blitz, PA-C 08/01/16 [place], PA-C 08/01/16 1349    Leonard Schwartz, MD 08/03/16 [phone]

## 2016-08-01 NOTE — ED Triage Notes (Addendum)
Presents with left index finger injury from a table saw while cutting wood. Finger tip mostly gone-c/o severe pain, unknown last tetanus.

## 2016-08-01 NOTE — Anesthesia Preprocedure Evaluation (Addendum)
Anesthesia Evaluation  Patient identified by MRN, date of birth, ID band Patient awake    Reviewed: Allergy & Precautions, NPO status , Patient's Chart, lab work & pertinent test results  Airway Mallampati: IV  TM Distance: >3 FB Neck ROM: Full    Dental  (+) Teeth Intact, Dental Advisory Given   Pulmonary Current Smoker,    breath sounds clear to auscultation       Cardiovascular negative cardio ROS   Rhythm:Regular Rate:Normal     Neuro/Psych  Headaches, negative psych ROS   GI/Hepatic negative GI ROS, Neg liver ROS,   Endo/Other  negative endocrine ROS  Renal/GU negative Renal ROS  negative genitourinary   Musculoskeletal negative musculoskeletal ROS (+)   Abdominal   Peds negative pediatric ROS (+)  Hematology   Anesthesia Other Findings   Reproductive/Obstetrics negative OB ROS                            Anesthesia Physical Anesthesia Plan  ASA: III  Anesthesia Plan: General   Post-op Pain Management:    Induction: Intravenous  Airway Management Planned: LMA  Additional Equipment:   Intra-op Plan:   Post-operative Plan: Extubation in OR  Informed Consent: I have reviewed the patients History and Physical, chart, labs and discussed the procedure including the risks, benefits and alternatives for the proposed anesthesia with the patient or authorized representative who has indicated his/her understanding and acceptance.   Dental advisory given  Plan Discussed with: CRNA  Anesthesia Plan Comments: (Pt endorses no food since 9/13 and had small amount of Dr. Malachi Bonds this AM. )       Anesthesia Quick Evaluation

## 2016-08-01 NOTE — Progress Notes (Signed)
Pt using toilet.states something does not feel right. I'm wet.nurse offered pt wet washcloth to wash perineal area.pt states I feel like I've had sex.Nurse assured pt that she had been asleep in recovery room.

## 2016-08-01 NOTE — ED Notes (Signed)
Pt to be transferred by POV to [facility] per her request. Attempting to contact family member for ride at this time

## 2016-08-01 NOTE — Discharge Instructions (Addendum)

## 2016-08-01 NOTE — Brief Op Note (Signed)
08/01/2016  4:24 PM  PATIENT:  Stacey Weeks  40 y.o. female  PRE-OPERATIVE DIAGNOSIS:  Left Index finger saw injury  POST-OPERATIVE DIAGNOSIS:  left index open distal phalanx fracture, skin and nail bed lacerations  PROCEDURE:  Left index I&D of open fracture, open treatment open distal phalanx fracture, repair skin and nail bed lacerations  SURGEON:  Surgeon(s) and Role:    * Leanora Cover, MD - Primary  PHYSICIAN ASSISTANT:   ASSISTANTS: none   ANESTHESIA:   general  EBL:  Total I/O In: 1050 [I.V.:1000; IV Piggyback:50] Out: -   BLOOD ADMINISTERED:none  DRAINS: none   LOCAL MEDICATIONS USED:  MARCAINE     SPECIMEN:  No Specimen  DISPOSITION OF SPECIMEN:  N/A  COUNTS:  YES  TOURNIQUET:   Total Tourniquet Time Documented: Upper Arm (laterality) - 26 minutes Total: Upper Arm (laterality) - 26 minutes   DICTATION: .Other Dictation: Dictation Number no confirmation number given  PLAN OF CARE: Discharge to home after PACU  PATIENT DISPOSITION:  PACU - hemodynamically stable.

## 2016-08-01 NOTE — Anesthesia Procedure Notes (Signed)
Procedure Name: LMA Insertion Date/Time: 08/01/2016 3:41 PM Performed by: Lariah Fleer D Pre-anesthesia Checklist: Patient identified, Emergency Drugs available, Suction available and Patient being monitored Patient Re-evaluated:Patient Re-evaluated prior to inductionOxygen Delivery Method: Circle system utilized Preoxygenation: Pre-oxygenation with 100% oxygen Intubation Type: IV induction Ventilation: Mask ventilation without difficulty LMA: LMA inserted LMA Size: 4.0 Number of attempts: 1 Airway Equipment and Method: Bite block Placement Confirmation: positive ETCO2 Tube secured with: Tape Dental Injury: Teeth and Oropharynx as per pre-operative assessment

## 2016-08-01 NOTE — Transfer of Care (Signed)
Immediate Anesthesia Transfer of Care Note  Patient: Stacey Weeks  Procedure(s) Performed: Procedure(s): IRRIGATION AND DEBRIDEMENT WOUND (Left)  Patient Location: PACU  Anesthesia Type:General  Level of Consciousness: awake, alert , oriented and patient cooperative  Airway & Oxygen Therapy: Patient Spontanous Breathing and Patient connected to face mask oxygen  Post-op Assessment: Report given to RN and Post -op Vital signs reviewed and stable  Post vital signs: Reviewed and stable  Last Vitals:  Vitals:   08/01/16 1544 08/01/16 1630  BP: (!) 143/86 (!) (P) 142/96  Pulse: 90   Resp: 16   Temp: 36.6 C (P) 36.4 C    Last Pain:  Vitals:   08/01/16 1544  TempSrc: Oral  PainSc: 8          Complications: No apparent anesthesia complications

## 2016-08-01 NOTE — Anesthesia Postprocedure Evaluation (Signed)
Anesthesia Post Note  Patient: Stacey Weeks  Procedure(s) Performed: Procedure(s) (LRB): IRRIGATION AND DEBRIDEMENT WOUND (Left)  Patient location during evaluation: PACU Anesthesia Type: General Level of consciousness: awake, oriented, patient cooperative and sedated Pain management: pain level controlled Vital Signs Assessment: post-procedure vital signs reviewed and stable Respiratory status: spontaneous breathing and respiratory function stable Cardiovascular status: stable Anesthetic complications: no    Last Vitals:  Vitals:   08/01/16 1544 08/01/16 1630  BP: (!) 143/86 (!) 142/96  Pulse: 90   Resp: 16   Temp: 36.6 C 36.4 C    Last Pain:  Vitals:   08/01/16 1630  TempSrc:   PainSc: Asleep                 Shambria Camerer EDWARD

## 2016-08-01 NOTE — ED Notes (Signed)
Discussed PO potassium order with Elmyra Ricks, EDPA as pt is going to surgery center for procedure. States to give with a sip of water

## 2016-08-01 NOTE — H&P (Signed)
Stacey Weeks is an 40 y.o. female.   Chief Complaint: left index finger table saw injury HPI: 40 yo rhd female states she injured left index finger on table saw at home today while working on flooring.  Seen at [facility] where XR revealed distal phalanx fracture.  Wounds to distal aspect of digit.  She describes a throbbing pain of 10/10 severity.  It is alleviated and aggravated by nothing.  Note by Monico Blitz, PA-C from 08/01/2016 reviewed. Xrays viewed and interpreted by me: 3 views left index finger show tuft fracture and fracture at ulnar base of distal phalanx without displacement. Labs reviewed: none  Allergies:  Allergies  Allergen Reactions  . Percocet [Oxycodone-Acetaminophen] Itching and Other (See Comments)    Itching in mouth; lightheadedness - pt states she can take it with benadryl     Past Medical History:  Diagnosis Date  . Active smoker   . Anemia   . Blood transfusion 2000  . Blood transfusion without reported diagnosis   . Chronic pelvic pain in female   . Endometriosis   . Fibroid, uterus   . ZHGDJMEQ(683.4)     Past Surgical History:  Procedure Laterality Date  . ABDOMINAL HYSTERECTOMY  12/11/2011   Procedure: HYSTERECTOMY ABDOMINAL;  Surgeon: Margarette Asal, MD;  Location: [facility];  Service: Gynecology;  Laterality: N/A;  . CYSTOSCOPY    . GANGLION CYST EXCISION  age 66   right wrist  . HYSTEROSCOPY W/D&C  10-26-10   polypectomy  . LAPAROSCOPY  10/01/2011   Procedure: LAPAROSCOPY DIAGNOSTIC;  Surgeon: Margarette Asal;  Location: [place];  Service: Gynecology;  Laterality: N/A;  . SALPINGOOPHORECTOMY  12/11/2011   Procedure: SALPINGO OOPHERECTOMY;  Surgeon: Margarette Asal, MD;  Location: [facility];  Service: Gynecology;  Laterality: Bilateral;    Family History: Family History  Problem Relation Age of Onset  . Diabetes Maternal Grandmother   . Hypertension Maternal Grandmother   . Heart disease Maternal Grandmother      Social History:   reports that she has been smoking Cigarettes.  She has a 3.00 pack-year smoking history. She has never used smokeless tobacco. She reports that she does not drink alcohol or use drugs.  Medications: Medications Prior to Admission  Medication Sig Dispense Refill  . hydrocortisone (ANUSOL-HC) 2.5 % rectal cream Apply rectally 2 times daily 28.35 g 1  . phenazopyridine (PYRIDIUM) 200 MG tablet Take 1 tablet (200 mg total) by mouth 3 (three) times daily. 6 tablet 0    Results for orders placed or performed during the hospital encounter of 08/01/16 (from the past 48 hour(s))  CBC     Status: None   Collection Time: 08/01/16 11:08 AM  Result Value Ref Range   WBC 9.5 4.0 - 10.5 K/uL   RBC 4.41 3.87 - 5.11 MIL/uL   Hemoglobin 12.7 12.0 - 15.0 g/dL   HCT 37.4 36.0 - 46.0 %   MCV 84.8 78.0 - 100.0 fL   MCH 28.8 26.0 - 34.0 pg   MCHC 34.0 30.0 - 36.0 g/dL   RDW 13.3 11.5 - 15.5 %   Platelets 392 150 - 400 K/uL  Basic metabolic panel     Status: Abnormal   Collection Time: 08/01/16 11:08 AM  Result Value Ref Range   Sodium 139 135 - 145 mmol/L   Potassium 3.2 (L) 3.5 - 5.1 mmol/L   Chloride 106 101 - 111 mmol/L   CO2 24 22 - 32 mmol/L  Glucose, Bld 120 (H) 65 - 99 mg/dL   BUN 13 6 - 20 mg/dL   Creatinine, Ser 0.77 0.44 - 1.00 mg/dL   Calcium 9.6 8.9 - 10.3 mg/dL   GFR calc non Af Amer >60 >60 mL/min   GFR calc Af Amer >60 >60 mL/min    Comment: (NOTE) The eGFR has been calculated using the CKD EPI equation. This calculation has not been validated in all clinical situations. eGFR's persistently <60 mL/min signify possible Chronic Kidney Disease.    Anion gap 9 5 - 15    Dg Hand Complete Left  Result Date: 08/01/2016 CLINICAL DATA:  Cut tip of finger in saw EXAM: LEFT HAND - COMPLETE 3+ VIEW COMPARISON:  None FINDINGS: There is a comminuted fracture deformity involving the distal shaft and tuft of the second distal phalanx. Intra-articular fracture is noted  involving the medial base of the second distal phalanx. Overlying soft tissue laceration is identified. IMPRESSION: 1. Comminuted fracture deformities involve the proximal and distal aspect of the second distal phalanx. Electronically Signed   By: Kerby Moors M.D.   On: 08/01/2016 11:54     A comprehensive review of systems was negative. Review of Systems: No fevers, chills, night sweats, chest pain, shortness of breath, nausea, vomiting, diarrhea, constipation, easy bleeding or bruising, headaches, dizziness, vision changes, fainting.   Blood pressure 125/86, pulse 86, temperature 97.9 F (36.6 C), temperature source Oral, resp. rate 17, last menstrual period 12/14/2010, SpO2 97 %.  General appearance: alert, cooperative and appears stated age Head: Normocephalic, without obvious abnormality, atraumatic Neck: supple, symmetrical, trachea midline Resp: clear to auscultation bilaterally Cardio: regular rate and rhythm GI: non-tender Extremities: Intact sensation and capillary refill all digits.  +epl/fpl/io.  Left index with nail injury and mangled wound at pad of finger.  Able to flex and extend dip joint. Pulses: 2+ and symmetric Skin: Skin color, texture, turgor normal. No rashes or lesions Neurologic: Grossly normal Incision/Wound: As above  Assessment/Plan Left index finger table saw injury.  Recommend OR for irrigation and debridement of fracture with possible pinning and repair of skin and nail bed lacerations.  Risks, benefits, and alternatives of surgery were discussed and the patient agrees with the plan of care.   Darran Gabay R 08/01/2016, 3:35 PM

## 2016-08-01 NOTE — ED Notes (Signed)
Pt states only ride she has is her friend and he doesn't have a valid license. Pt agreed to go by [employer] 

## 2016-08-02 NOTE — Op Note (Signed)
Stacey, Weeks NO.:  [id number]  MEDICAL RECORD NO.:  [id number]  LOCATION:  [facility]                         FACILITY:  [facility]  PHYSICIAN:  Leanora Cover, MD        DATE OF BIRTH:  04-15-1976  DATE OF PROCEDURE:  08/01/2016 DATE OF DISCHARGE:                              OPERATIVE REPORT   PREOPERATIVE DIAGNOSIS:  Left index finger table saw injury with open distal phalanx fracture and skin and nail bed lacerations.  POSTOPERATIVE DIAGNOSIS:  Left index finger table saw injury with open distal phalanx fracture and skin and nail bed lacerations.  PROCEDURE:   1. Left index finger irrigation and debridement of open distal phalanx fracture including skin, subcutaneous tissues, and bone.   2. Open treatment of left index finger open distal phalanx fracture 3. Repair of left index finger skin and nail bed lacerations.  SURGEON:  Leanora Cover, MD  ASSISTANT:  None.  ANESTHESIA:  General.  IV FLUIDS:  Per Anesthesia flow sheet.  ESTIMATED BLOOD LOSS:  Minimal.  COMPLICATIONS:  None.  SPECIMENS:  None.  TOURNIQUET TIME:  26 minutes.  DISPOSITION:  Stable to PACU.  INDICATIONS:  Stacey Weeks is a 40 year old right-hand dominant female who states while working at home today on flooring, she injured the left index finger on the table saw.  She was seen at [facility]. Radiographs were taken revealing a distal phalanx fracture.  I was consulted for management of the injury.  She was transferred to [facility] for evaluation and care.  I recommended operative irrigation and debridement of the open fracture with possible pinning of the fracture and repair of skin and nail bed lacerations in the operating room. Risks, benefits, and alternatives of surgery were discussed including the risk of blood loss; infection; damage to nerves, vessels, tendons, ligaments, bone, failure of surgery, need for additional surgery, complications with wound healing,  continued pain, and stiffness.  She voiced understanding of these risks and elected to proceed.  Her tetanus had been updated by the Emergency Department.  OPERATIVE COURSE:  After being identified preoperatively by myself, the patient agreed upon procedure and site of procedure.  Surgical site was marked.  Risks, benefits, and alternatives of surgery were reviewed and she wished to proceed.  Surgical consent had been signed.  She was given IV Ancef as preoperative antibiotic coverage.  She was transported to the operating room and placed on the operating room table in supine position with the left upper extremity on arm board.  General anesthesia was induced by anesthesiologist.  Left upper extremity was prepped and draped in normal sterile orthopedic fashion.  Surgical pause was performed between surgeons, anesthesia, and operating room staff and all were in agreement as to the patient, procedure, and site of procedure. Tourniquet at the proximal aspect of the extremity was inflated to 250 mmHg after exsanguination of the limb with an Esmarch bandage.  The wound was explored.  There was no gross contamination.  The fracture was opened.  There was mangled skin.  This was sharply debrided with the scissors including the epidermis and subcutaneous tissues.  There were small bony fragments as part  of the open fracture that were sharply debrided with the scissors as well and removed.  The wound was copiously irrigated with 500 mL of sterile saline by bulb syringe.  Again, there was no gross contamination noted.  The fracture was reduced under direct visualization.  The soft tissues were reduced as well.  5-0 Monocryl suture was used to reapproximate the skin as best possible.  There was some skin loss in the pad of the finger.  Good soft tissue coverage over the bone was achieved however.  6-0 chromic suture was used to reapproximate the nail bed tissues.  This was done in an  interrupted fashion.  The nail had been removed with a Soil scientist.  Good reapposition of soft tissues were obtained.  There was acceptable contour of the finger.  The distal nail bed tissue was attached only by a small bridge of tissue at the radial side.  The nail fold was then kept open with a Xeroform and the nail plate trimmed and replaced to provide support.  The wounds were then dressed with sterile Xeroform, 4 x 4, and wrapped with a Coban dressing lightly.  An AlumaFoam splint was placed and wrapped lightly with a Coban dressing.  A digital block was performed with 10 mL of 0.25% plain Marcaine to aid in postoperative analgesia.  The tourniquet was deflated at 26 minutes.  Fingertips were pink with brisk capillary refill after deflation of the tourniquet.  The operative drapes were broken down and the patient was awoken from anesthesia safely.  She was transferred back to the stretcher and taken to PACU in stable condition.  I will see her back in the office in 1 week for postoperative followup.  I will give her Half Moon, one to two p.o. q.6 hours p.r.n. pain, dispensed #30.  She states she has taken this in the past without problem.  She has allergy to Percocet however. We will also give her a prescription for Bactrim DS 1 p.o. b.i.d. x7 days.     Leanora Cover, MD     KK/MEDQ  D:  08/01/2016  T:  08/02/2016  Job:  [id number]

## 2016-08-04 ENCOUNTER — Encounter (HOSPITAL_BASED_OUTPATIENT_CLINIC_OR_DEPARTMENT_OTHER): Payer: Self-pay | Admitting: Orthopedic Surgery

## 2016-08-07 DIAGNOSIS — S62661D Nondisplaced fracture of distal phalanx of left index finger, subsequent encounter for fracture with routine healing: Secondary | ICD-10-CM | POA: Insufficient documentation

## 2016-08-07 DIAGNOSIS — S61321A Laceration with foreign body of left index finger with damage to nail, initial encounter: Secondary | ICD-10-CM | POA: Insufficient documentation

## 2017-05-26 ENCOUNTER — Encounter (HOSPITAL_BASED_OUTPATIENT_CLINIC_OR_DEPARTMENT_OTHER): Payer: Self-pay | Admitting: Emergency Medicine

## 2017-05-26 ENCOUNTER — Emergency Department (HOSPITAL_BASED_OUTPATIENT_CLINIC_OR_DEPARTMENT_OTHER)
Admission: EM | Admit: 2017-05-26 | Discharge: 2017-05-26 | Disposition: A | Discharge status: Home / Self Care | Payer: BLUE CROSS/BLUE SHIELD | Attending: Emergency Medicine | Admitting: Emergency Medicine

## 2017-05-26 DIAGNOSIS — Z79899 Other long term (current) drug therapy: Secondary | ICD-10-CM | POA: Insufficient documentation

## 2017-05-26 DIAGNOSIS — F17210 Nicotine dependence, cigarettes, uncomplicated: Secondary | ICD-10-CM | POA: Insufficient documentation

## 2017-05-26 DIAGNOSIS — L03113 Cellulitis of right upper limb: Secondary | ICD-10-CM | POA: Insufficient documentation

## 2017-05-26 DIAGNOSIS — W57XXXA Bitten or stung by nonvenomous insect and other nonvenomous arthropods, initial encounter: Secondary | ICD-10-CM | POA: Insufficient documentation

## 2017-05-26 MED ORDER — CLINDAMYCIN HCL 150 MG PO CAPS
300.0000 mg | ORAL_CAPSULE | Freq: Once | ORAL | Status: AC
  Administered 2017-05-26: 300 mg via ORAL
  Filled 2017-05-26: qty 2

## 2017-05-26 MED ORDER — CLINDAMYCIN HCL 300 MG PO CAPS: 300.0000 mg | ORAL_CAPSULE | Freq: Three times a day (TID) | ORAL | 0 refills | Status: DC

## 2017-05-26 MED ORDER — IBUPROFEN 400 MG PO TABS
600.0000 mg | ORAL_TABLET | Freq: Once | ORAL | Status: AC
  Administered 2017-05-26: 600 mg via ORAL
  Filled 2017-05-26: qty 1

## 2017-05-26 MED FILL — CLINDAMYCIN HCL 300 MG CAPS: 300 | 7 days supply | Qty: 21 | Fill #0

## 2017-05-26 NOTE — Discharge Instructions (Signed)
Have been giving her first dose of antibiotic in the ED today. Take 2 more doses today.   You have been seen today in the Emergency Department for cellulitis, a superficial skin infection. Please take your antibiotics as prescribed for their ENTIRE prescribed duration.   May alternate between motrin and tylenol for fever, pain, and swelling.  Please follow up with your doctor or return to the ER in 2 days for recheck of your infection if you are not improving.  Call your doctor sooner or return to the ER if you develop worsening signs of infection such as: increased redness, increased pain, pus, fever, or other symptoms that concern you.

## 2017-05-26 NOTE — ED Provider Notes (Signed)
[facility] Provider Note   CSN: [id number] Arrival date & time: 05/26/17  0849     History   Chief Complaint Chief Complaint  Patient presents with  . Skin Problem    HPI Stacey Weeks is a 41 y.o. female.  HPI 41 year old female with no significant past medical history presents to the emergency Department today with complaints of bug bite. Patient states that she was bitten by something 2 days ago. The pain and redness started in her right palm and has extended up her arm. She has not tried anything for her symptoms at home. She denies any drainage from the site. She denies any fever, chills, nausea, vomiting. Full range of motion of all joints. Past Medical History:  Diagnosis Date  . Active smoker   . Anemia   . Blood transfusion 2000  . Blood transfusion without reported diagnosis   . Chronic pelvic pain in female   . Endometriosis   . Fibroid, uterus   . Headache(784.0)     There are no active problems to display for this patient.   Past Surgical History:  Procedure Laterality Date  . ABDOMINAL HYSTERECTOMY  12/11/2011   Procedure: HYSTERECTOMY ABDOMINAL;  Surgeon: Margarette Asal, MD;  Location: [place] [facility];  Service: Gynecology;  Laterality: N/A;  . CYSTOSCOPY    . GANGLION CYST EXCISION  age 58   right wrist  . HYSTEROSCOPY W/D&C  10-26-10   polypectomy  . INCISION AND DRAINAGE OF WOUND Left 08/01/2016   Procedure: IRRIGATION AND DEBRIDEMENT WOUND;  Surgeon: Leanora Cover, MD;  Location: [place];  Service: Orthopedics;  Laterality: Left;  . LAPAROSCOPY  10/01/2011   Procedure: LAPAROSCOPY DIAGNOSTIC;  Surgeon: Margarette Asal;  Location: [place];  Service: Gynecology;  Laterality: N/A;  . SALPINGOOPHORECTOMY  12/11/2011   Procedure: SALPINGO OOPHERECTOMY;  Surgeon: Margarette Asal, MD;  Location: [facility];  Service: Gynecology;  Laterality: Bilateral;    OB History    Gravida Para Term Preterm AB Living   0              SAB TAB Ectopic Multiple Live Births                   Home Medications    Prior to Admission medications   Medication Sig Start Date End Date Taking? Authorizing Provider  HYDROcodone-acetaminophen (NORCO) 5-325 MG tablet 1-2 tabs po q6 hours prn pain 08/01/16   Leanora Cover, MD  hydrocortisone (ANUSOL-HC) 2.5 % rectal cream Apply rectally 2 times daily 03/15/15   Blanchie Dessert, MD  phenazopyridine (PYRIDIUM) 200 MG tablet Take 1 tablet (200 mg total) by mouth 3 (three) times daily. 03/15/15   Blanchie Dessert, MD  sulfamethoxazole-trimethoprim (BACTRIM DS) 800-160 MG tablet Take 1 tablet by mouth 2 (two) times daily. 08/01/16   Leanora Cover, MD    Family History Family History  Problem Relation Age of Onset  . Diabetes Maternal Grandmother   . Hypertension Maternal Grandmother   . Heart disease Maternal Grandmother     Social History Social History  Substance Use Topics  . Smoking status: Current Every Day Smoker    Packs/day: 0.50    Years: 6.00    Types: Cigarettes  . Smokeless tobacco: Never Used  . Alcohol use No     Allergies   Percocet [oxycodone-acetaminophen]   Review of Systems Review of Systems  Constitutional: Negative for chills and fever.  Gastrointestinal: Negative for nausea and vomiting.  Musculoskeletal: Positive for arthralgias and myalgias.  Skin: Positive for color change.  Neurological: Negative for headaches.     Physical Exam Updated Vital Signs BP (!) 141/97 (BP Location: Left Arm)   Pulse 86   Temp 98.2 F (36.8 C)   Resp 18   Ht 5' 7.5" (1.715 m)   Wt 104.3 kg (230 lb)   LMP 09/15/2011   SpO2 100%   BMI 35.49 kg/m   Physical Exam  Constitutional: She appears well-developed and well-nourished. No distress.  HENT:  Head: Normocephalic and atraumatic.  Eyes: Right eye exhibits no discharge. Left eye exhibits no discharge. No scleral icterus.  Neck: Normal range of motion. Neck supple.  Cardiovascular: Normal  rate, regular rhythm, normal heart sounds and intact distal pulses.   Pulmonary/Chest: No respiratory distress.  Musculoskeletal: Normal range of motion.  Full range of motion of all joints. No erythema of the joints. Radial pulses 2+ bilaterally. Sensation intact. Cap refill normal.  Lymphadenopathy:    She has no cervical adenopathy.  Neurological: She is alert.  Skin: Skin is warm and dry. No pallor.  Appears to be a bite to the right proximal palm with surrounding erythema, induration, warmth. Mild erythema that extends up the forearm without any edema. No area of fluctuance or drainage concerning for an abscess.   No axillary lymphadenopathy.  Psychiatric: Her behavior is normal. Judgment and thought content normal.  Nursing note and vitals reviewed.            ED Treatments / Results  Labs (all labs ordered are listed, but only abnormal results are displayed) Labs Reviewed - No data to display  EKG  EKG Interpretation None       Radiology No results found.  Procedures Procedures (including critical care time)  Medications Ordered in ED Medications  ibuprofen (ADVIL,MOTRIN) tablet 600 mg (not administered)  clindamycin (CLEOCIN) capsule 300 mg (not administered)     Initial Impression / Assessment and Plan / ED Course  I have reviewed the triage vital signs and the nursing notes.  Pertinent labs & imaging results that were available during my care of the patient were reviewed by me and considered in my medical decision making (see chart for details).     Patient presentation consistent with cellulitis. Afebrile. No tachycardia, hypotension or other symptoms suggestive of severe infection. Area has been demarcated and pt advised to follow up for wound check in 2-3 days, sooner for worsening systemic symptoms, new lymphangitis, or significant spread of erythema past line of demarcation. No signs of drainable abscess at this time,however if return with no  improvement may need iandd intervention. Will discharge with clindamycin. Return precautions discussed. Pt appears safe for discharge.    Final Clinical Impressions(s) / ED Diagnoses   Final diagnoses:  Insect bite, initial encounter  Cellulitis of right upper extremity    New Prescriptions New Prescriptions   No medications on file     Aaron Edelman 05/26/17 3552    Drenda Freeze, MD 05/26/17 [phone]

## 2017-05-26 NOTE — ED Triage Notes (Signed)
Pt c/o pain to RT hand extending to RUE from what she thinks is a bug bite

## 2017-05-28 ENCOUNTER — Emergency Department (HOSPITAL_BASED_OUTPATIENT_CLINIC_OR_DEPARTMENT_OTHER)
Admission: EM | Admit: 2017-05-28 | Discharge: 2017-05-28 | Disposition: A | Discharge status: Home / Self Care | Payer: BLUE CROSS/BLUE SHIELD | Attending: Emergency Medicine | Admitting: Emergency Medicine

## 2017-05-28 ENCOUNTER — Encounter (HOSPITAL_BASED_OUTPATIENT_CLINIC_OR_DEPARTMENT_OTHER): Payer: Self-pay | Admitting: Emergency Medicine

## 2017-05-28 DIAGNOSIS — F17210 Nicotine dependence, cigarettes, uncomplicated: Secondary | ICD-10-CM | POA: Insufficient documentation

## 2017-05-28 DIAGNOSIS — L02511 Cutaneous abscess of right hand: Secondary | ICD-10-CM | POA: Insufficient documentation

## 2017-05-28 DIAGNOSIS — L0291 Cutaneous abscess, unspecified: Secondary | ICD-10-CM

## 2017-05-28 NOTE — ED Triage Notes (Addendum)
Here for a recheck of spider bite to right palm area, was seen in ED x 2 days ago. Taking Clindamycin as prescribed . Blister type wound noted

## 2017-05-28 NOTE — ED Provider Notes (Signed)
[facility] Provider Note   CSN: [id number] Arrival date & time: 05/28/17  1738 By signing my name below, I, Dyke Brackett, attest that this documentation has been prepared under the direction and in the presence of Pearlie Nies, Grayce Sessions, MD . Electronically Signed: Dyke Brackett, Scribe. 05/28/2017. 6:58 PM.   History   Chief Complaint Chief Complaint  Patient presents with  . Wound Check    HPI Stacey Weeks is a 41 y.o. female who presents to the Emergency Department for evaluation of a gradually worsening, moderately painful lesion to her right palm onset four days ago. Pain is exacerbated by direct palpation; no alleviating factors noted.  She believes she was bitten by an insect, but did not see anything bit her. She was seen for the same two days ago and was prescribed Clindamycin which resolved the associated mild erythema to her right forearm. Pt denies any fever and has no other acute complaints or associated symptoms at this time.   The history is provided by the patient. No language interpreter was used.    Past Medical History:  Diagnosis Date  . Active smoker   . Anemia   . Blood transfusion 2000  . Blood transfusion without reported diagnosis   . Chronic pelvic pain in female   . Endometriosis   . Fibroid, uterus   . Headache(784.0)    There are no active problems to display for this patient.  Past Surgical History:  Procedure Laterality Date  . ABDOMINAL HYSTERECTOMY  12/11/2011   Procedure: HYSTERECTOMY ABDOMINAL;  Surgeon: Margarette Asal, MD;  Location: [facility];  Service: Gynecology;  Laterality: N/A;  . CYSTOSCOPY    . GANGLION CYST EXCISION  age 55   right wrist  . HYSTEROSCOPY W/D&C  10-26-10   polypectomy  . INCISION AND DRAINAGE OF WOUND Left 08/01/2016   Procedure: IRRIGATION AND DEBRIDEMENT WOUND;  Surgeon: Leanora Cover, MD;  Location: [facility];  Service: Orthopedics;  Laterality: Left;  . LAPAROSCOPY  10/01/2011     Procedure: LAPAROSCOPY DIAGNOSTIC;  Surgeon: Margarette Asal;  Location: [facility];  Service: Gynecology;  Laterality: N/A;  . SALPINGOOPHORECTOMY  12/11/2011   Procedure: SALPINGO OOPHERECTOMY;  Surgeon: Margarette Asal, MD;  Location: [facility];  Service: Gynecology;  Laterality: Bilateral;    OB History    Gravida Para Term Preterm AB Living   0             SAB TAB Ectopic Multiple Live Births                   Home Medications    Prior to Admission medications   Medication Sig Start Date End Date Taking? Authorizing Provider  clindamycin (CLEOCIN) 300 MG capsule Take 1 capsule (300 mg total) by mouth 3 (three) times daily. 05/26/17  Yes Doristine Devoid, PA-C  HYDROcodone-acetaminophen (NORCO) 5-325 MG tablet 1-2 tabs po q6 hours prn pain 08/01/16   Leanora Cover, MD  hydrocortisone (ANUSOL-HC) 2.5 % rectal cream Apply rectally 2 times daily 03/15/15   Blanchie Dessert, MD  phenazopyridine (PYRIDIUM) 200 MG tablet Take 1 tablet (200 mg total) by mouth 3 (three) times daily. 03/15/15   Blanchie Dessert, MD  sulfamethoxazole-trimethoprim (BACTRIM DS) 800-160 MG tablet Take 1 tablet by mouth 2 (two) times daily. 08/01/16   Leanora Cover, MD    Family History Family History  Problem Relation Age of Onset  . Diabetes Maternal Grandmother   . Hypertension Maternal  Grandmother   . Heart disease Maternal Grandmother     Social History Social History  Substance Use Topics  . Smoking status: Current Every Day Smoker    Packs/day: 0.50    Years: 6.00    Types: Cigarettes  . Smokeless tobacco: Never Used  . Alcohol use No     Allergies   Percocet [oxycodone-acetaminophen]   Review of Systems Review of Systems  Constitutional: Negative for fever.  Skin: Positive for color change and wound.   Physical Exam Updated Vital Signs BP 115/74 (BP Location: Left Arm)   Pulse 74   Temp 98.3 F (36.8 C) (Oral)   Resp 16   Ht 5\' 7"  (1.702 m)   Wt 104.3 kg  (230 lb)   LMP 09/15/2011   SpO2 99%   BMI 36.02 kg/m   Physical Exam  Constitutional: She is oriented to person, place, and time. She appears well-developed and well-nourished. No distress.  HENT:  Head: Normocephalic and atraumatic.  Right Ear: External ear normal.  Left Ear: External ear normal.  Nose: Nose normal.  Eyes: Conjunctivae and EOM are normal. No scleral icterus.  Neck: Normal range of motion and phonation normal.  Cardiovascular: Normal rate and regular rhythm.   Pulmonary/Chest: Effort normal. No stridor. No respiratory distress.  Abdominal: She exhibits no distension.  Musculoskeletal: Normal range of motion. She exhibits no edema.  Neurological: She is alert and oriented to person, place, and time.  Skin: She is not diaphoretic.  1.5 cm and 4 mm superficial abscesses to right lateral hypothenar region. No surrounding erythema or streaking erythema.   Psychiatric: She has a normal mood and affect. Her behavior is normal.  Vitals reviewed.    ED Treatments / Results  DIAGNOSTIC STUDIES:  Oxygen Saturation is 100% on RA, normal by my interpretation.    COORDINATION OF CARE:  6:57 PM Discussed treatment plan with pt at bedside and pt agreed to plan.   Labs (all labs ordered are listed, but only abnormal results are displayed) Labs Reviewed - No data to display  EKG  EKG Interpretation None       Radiology No results found.  Procedures .Marland KitchenIncision and Drainage Date/Time: 05/28/2017 7:11 PM Performed by: Fatima Blank Authorized by: Fatima Blank   Consent:    Consent obtained:  Verbal   Consent given by:  Patient   Alternatives discussed:  No treatment Location:    Type:  Abscess   Size:  1.5 cm, 4 mm   Location:  Upper extremity   Upper extremity location:  Hand   Hand location:  R hand Pre-procedure details:    Skin preparation:  Betadine Anesthesia (see MAR for exact dosages):    Anesthesia method:  None Procedure  type:    Complexity:  Simple Procedure details:    Needle aspiration: yes     Needle size:  18 G   Drainage:  Purulent   Drainage amount:  Scant   Wound treatment:  Wound left open   Packing materials:  None Post-procedure details:    Patient tolerance of procedure:  Tolerated well, no immediate complications     (including critical care time)  Medications Ordered in ED Medications - No data to display   Initial Impression / Assessment and Plan / ED Course  I have reviewed the triage vital signs and the nursing notes.  Pertinent labs & imaging results that were available during my care of the patient were reviewed by me and considered in  my medical decision making (see chart for details).     Improving cellulitis with new formation of abscess. Aspiration and drainage performed as above. Recommended continuing antibiotic course. Safe for discharge with strict return precautions.  Final Clinical Impressions(s) / ED Diagnoses   Final diagnoses:  Abscess   Disposition: Discharge  Condition: Good  I have discussed the results, Dx and Tx plan with the patient who expressed understanding and agree(s) with the plan. Discharge instructions discussed at great length. The patient was given strict return precautions who verbalized understanding of the instructions. No further questions at time of discharge.    Discharge Medication List as of 05/28/2017  7:12 PM      Follow Up: [facility] [address] [id number] [place] [address] [phone] In 3 days If symptoms do not improve or  worsen  I personally performed the services described in this documentation, which was scribed in my presence. The recorded information has been reviewed and is accurate.         Fatima Blank, MD 05/28/17 [phone]

## 2017-09-17 DIAGNOSIS — F17210 Nicotine dependence, cigarettes, uncomplicated: Secondary | ICD-10-CM | POA: Insufficient documentation

## 2017-09-17 DIAGNOSIS — R430 Anosmia: Secondary | ICD-10-CM | POA: Insufficient documentation

## 2017-09-17 DIAGNOSIS — Z9109 Other allergy status, other than to drugs and biological substances: Secondary | ICD-10-CM | POA: Insufficient documentation

## 2017-12-28 ENCOUNTER — Other Ambulatory Visit: Payer: Self-pay

## 2017-12-28 ENCOUNTER — Emergency Department (HOSPITAL_BASED_OUTPATIENT_CLINIC_OR_DEPARTMENT_OTHER)
Admission: EM | Admit: 2017-12-28 | Discharge: 2017-12-28 | Disposition: A | Discharge status: Home / Self Care | Payer: Self-pay | Attending: Emergency Medicine | Admitting: Emergency Medicine

## 2017-12-28 ENCOUNTER — Encounter (HOSPITAL_BASED_OUTPATIENT_CLINIC_OR_DEPARTMENT_OTHER): Payer: Self-pay | Admitting: Emergency Medicine

## 2017-12-28 DIAGNOSIS — R51 Headache: Secondary | ICD-10-CM | POA: Insufficient documentation

## 2017-12-28 DIAGNOSIS — Z5321 Procedure and treatment not carried out due to patient leaving prior to being seen by health care provider: Secondary | ICD-10-CM | POA: Insufficient documentation

## 2017-12-28 NOTE — ED Notes (Signed)
No answer X 1 from waiting room

## 2017-12-28 NOTE — ED Triage Notes (Signed)
PT presents with c/o headache for 3 days

## 2017-12-29 NOTE — ED Notes (Signed)
12/29/2017 , Follow-up call completed

## 2018-01-29 ENCOUNTER — Emergency Department (HOSPITAL_BASED_OUTPATIENT_CLINIC_OR_DEPARTMENT_OTHER): Payer: Self-pay

## 2018-01-29 ENCOUNTER — Other Ambulatory Visit: Payer: Self-pay

## 2018-01-29 ENCOUNTER — Encounter (HOSPITAL_BASED_OUTPATIENT_CLINIC_OR_DEPARTMENT_OTHER): Payer: Self-pay | Admitting: Emergency Medicine

## 2018-01-29 ENCOUNTER — Emergency Department (HOSPITAL_BASED_OUTPATIENT_CLINIC_OR_DEPARTMENT_OTHER)
Admission: EM | Admit: 2018-01-29 | Discharge: 2018-01-29 | Disposition: A | Discharge status: Home / Self Care | Payer: Self-pay | Attending: Emergency Medicine | Admitting: Emergency Medicine

## 2018-01-29 DIAGNOSIS — R0982 Postnasal drip: Secondary | ICD-10-CM | POA: Insufficient documentation

## 2018-01-29 DIAGNOSIS — R0789 Other chest pain: Secondary | ICD-10-CM | POA: Insufficient documentation

## 2018-01-29 DIAGNOSIS — F17210 Nicotine dependence, cigarettes, uncomplicated: Secondary | ICD-10-CM | POA: Insufficient documentation

## 2018-01-29 DIAGNOSIS — R0981 Nasal congestion: Secondary | ICD-10-CM | POA: Insufficient documentation

## 2018-01-29 DIAGNOSIS — R519 Headache, unspecified: Secondary | ICD-10-CM

## 2018-01-29 DIAGNOSIS — R51 Headache: Secondary | ICD-10-CM | POA: Insufficient documentation

## 2018-01-29 LAB — CBC WITH DIFFERENTIAL/PLATELET
BASOS PCT: 0 %
Basophils Absolute: 0 10*3/uL (ref 0.0–0.1)
Eosinophils Absolute: 0.1 10*3/uL (ref 0.0–0.7)
Eosinophils Relative: 3 %
HEMATOCRIT: 37.4 % (ref 36.0–46.0)
Hemoglobin: 12.4 g/dL (ref 12.0–15.0)
Lymphocytes Relative: 33 %
Lymphs Abs: 1.7 10*3/uL (ref 0.7–4.0)
MCH: 28.8 pg (ref 26.0–34.0)
MCHC: 33.2 g/dL (ref 30.0–36.0)
MCV: 87 fL (ref 78.0–100.0)
MONO ABS: 0.9 10*3/uL (ref 0.1–1.0)
MONOS PCT: 18 %
Neutro Abs: 2.4 10*3/uL (ref 1.7–7.7)
Neutrophils Relative %: 46 %
Platelets: 332 10*3/uL (ref 150–400)
RBC: 4.3 MIL/uL (ref 3.87–5.11)
RDW: 13.1 % (ref 11.5–15.5)
WBC: 5.1 10*3/uL (ref 4.0–10.5)

## 2018-01-29 LAB — BASIC METABOLIC PANEL
Anion gap: 6 (ref 5–15)
BUN: 10 mg/dL (ref 6–20)
CALCIUM: 9.1 mg/dL (ref 8.9–10.3)
CO2: 27 mmol/L (ref 22–32)
CREATININE: 0.74 mg/dL (ref 0.44–1.00)
Chloride: 105 mmol/L (ref 101–111)
GFR calc Af Amer: >60 mL/min (ref >60)
GFR calc non Af Amer: >60 mL/min (ref >60)
Glucose, Bld: 89 mg/dL (ref 65–99)
Potassium: 4.1 mmol/L (ref 3.5–5.1)
Sodium: 138 mmol/L (ref 135–145)

## 2018-01-29 LAB — TROPONIN I

## 2018-01-29 NOTE — ED Notes (Signed)
ED Provider at bedside. EDP came back to room per pt request to answer questions for her concerning her HAs and BP.

## 2018-01-29 NOTE — ED Provider Notes (Signed)
[facility] EMERGENCY DEPARTMENT Provider Note   CSN: [id number] Arrival date & time: 01/29/18  0957     History   Chief Complaint Chief Complaint  Patient presents with  . Headache  . Chest Pain    HPI Stacey Weeks is a 42 y.o. female.  HPI  42 year old female presents with a chief complaint of headache and hypertension.  She states she has had headaches on and off for about 6 months.  Has them about 3 times a week, usually when she first wakes up and then they go away.  Yesterday's seem to last all day.  They are usually frontal.  She has what she was told is a cyst on her right forehead that she thinks is a little bit larger.  She had a CT for it multiple years ago.  She states the headaches never seem to cause nausea, vomiting, blurry vision, photophobia, or weakness/numbness in her extremities.  Usually when she has a headache she checks her blood pressure and it is elevated.  Yesterday when she checked her blood pressure at [facility] it was 166/121.  Today is 149/99.  She also states she gets intermittent chest pain, once or twice a week.  She has had chest pain in between her breasts since yesterday continuously.  It is mild to moderate.  They do not always come with the headaches.  She thinks the headaches are related to blood pressure.  No shortness of breath or abdominal pain.  She also wonders if her headaches could be due to chronic sinus troubles.  She chronically has postnasal drip and sinus congestion is told she has allergies.  Recently was started on Claritin-D.  Past Medical History:  Diagnosis Date  . Active smoker   . Anemia   . Blood transfusion 2000  . Blood transfusion without reported diagnosis   . Chronic pelvic pain in female   . Endometriosis   . Fibroid, uterus   . Headache(784.0)     There are no active problems to display for this patient.   Past Surgical History:  Procedure Laterality Date  . ABDOMINAL HYSTERECTOMY  12/11/2011   Procedure: HYSTERECTOMY ABDOMINAL;  Surgeon: Margarette Asal, MD;  Location: [facility];  Service: Gynecology;  Laterality: N/A;  . CYSTOSCOPY    . GANGLION CYST EXCISION  age 33   right wrist  . HYSTEROSCOPY W/D&C  10-26-10   polypectomy  . INCISION AND DRAINAGE OF WOUND Left 08/01/2016   Procedure: IRRIGATION AND DEBRIDEMENT WOUND;  Surgeon: Leanora Cover, MD;  Location: [facility];  Service: Orthopedics;  Laterality: Left;  . LAPAROSCOPY  10/01/2011   Procedure: LAPAROSCOPY DIAGNOSTIC;  Surgeon: Margarette Asal;  Location: [facility];  Service: Gynecology;  Laterality: N/A;  . SALPINGOOPHORECTOMY  12/11/2011   Procedure: SALPINGO OOPHERECTOMY;  Surgeon: Margarette Asal, MD;  Location: [facility];  Service: Gynecology;  Laterality: Bilateral;    OB History    Gravida Para Term Preterm AB Living   0             SAB TAB Ectopic Multiple Live Births                   Home Medications    Prior to Admission medications   Medication Sig Start Date End Date Taking? Authorizing Provider  clindamycin (CLEOCIN) 300 MG capsule Take 1 capsule (300 mg total) by mouth 3 (three) times daily. 05/26/17   Doristine Devoid, PA-C  HYDROcodone-acetaminophen (  NORCO) 5-325 MG tablet 1-2 tabs po q6 hours prn pain 08/01/16   Leanora Cover, MD  hydrocortisone (ANUSOL-HC) 2.5 % rectal cream Apply rectally 2 times daily 03/15/15   Blanchie Dessert, MD  phenazopyridine (PYRIDIUM) 200 MG tablet Take 1 tablet (200 mg total) by mouth 3 (three) times daily. 03/15/15   Blanchie Dessert, MD  sulfamethoxazole-trimethoprim (BACTRIM DS) 800-160 MG tablet Take 1 tablet by mouth 2 (two) times daily. 08/01/16   Leanora Cover, MD    Family History Family History  Problem Relation Age of Onset  . Diabetes Maternal Grandmother   . Hypertension Maternal Grandmother   . Heart disease Maternal Grandmother     Social History Social History   Tobacco Use  . Smoking status: Current Every Day  Smoker    Packs/day: 0.50    Years: 6.00    Pack years: 3.00    Types: Cigarettes  . Smokeless tobacco: Never Used  Substance Use Topics  . Alcohol use: No  . Drug use: No     Allergies   Percocet [oxycodone-acetaminophen]   Review of Systems Review of Systems  HENT: Positive for congestion, postnasal drip and sinus pressure.   Eyes: Negative for photophobia and visual disturbance.  Respiratory: Negative for shortness of breath.   Cardiovascular: Positive for chest pain.  Gastrointestinal: Negative for abdominal pain, nausea and vomiting.  Neurological: Positive for headaches. Negative for weakness and numbness.  All other systems reviewed and are negative.    Physical Exam Updated Vital Signs BP (!) 133/91   Pulse 72   Temp 98.6 F (37 C) (Oral)   Resp 14   Ht 5' 7.5" (1.715 m)   Wt 99.8 kg (220 lb)   LMP 09/15/2011   SpO2 100%   BMI 33.95 kg/m   Physical Exam  Constitutional: She is oriented to person, place, and time. She appears well-developed and well-nourished.  HENT:  Head: Normocephalic and atraumatic.    Right Ear: External ear normal.  Left Ear: External ear normal.  Nose: Nose normal.  Eyes: EOM are normal. Pupils are equal, round, and reactive to light. Right eye exhibits no discharge. Left eye exhibits no discharge.  Neck: Normal range of motion. Neck supple.  Cardiovascular: Normal rate, regular rhythm and normal heart sounds.  Pulmonary/Chest: Effort normal and breath sounds normal. She exhibits no tenderness.  Abdominal: Soft. There is no tenderness.  Neurological: She is alert and oriented to person, place, and time.  CN 3-12 grossly intact. 5/5 strength in all 4 extremities. Grossly normal sensation. Normal finger to nose.   Skin: Skin is warm and dry.  Nursing note and vitals reviewed.    ED Treatments / Results  Labs (all labs ordered are listed, but only abnormal results are displayed) Labs Reviewed  BASIC METABOLIC PANEL  CBC  WITH DIFFERENTIAL/PLATELET  TROPONIN I    EKG  EKG Interpretation  Date/Time:  Thursday January 29 2018 10:11:00 EDT Ventricular Rate:  81 PR Interval:    QRS Duration: 94 QT Interval:  368 QTC Calculation: 428 R Axis:   35 Text Interpretation:  Sinus rhythm Baseline wander in lead(s) II no acute ST/T changes no significant change since 2016 Confirmed by Sherwood Gambler [id number]) on 01/29/2018 10:25:46 AM       Radiology Dg Chest 2 View  Result Date: 01/29/2018 CLINICAL DATA:  Chest pain, hypertension and headaches over the last 6 months, worsening over the last 2 days. EXAM: CHEST - 2 VIEW COMPARISON:  08/26/2017 FINDINGS: Heart size  is normal. Mediastinal shadows are normal. The lungs are clear. No bronchial thickening. No infiltrate, mass, effusion or collapse. Pulmonary vascularity is normal. No bony abnormality. IMPRESSION: Normal chest Electronically Signed   By: Nelson Chimes M.D.   On: 01/29/2018 10:58    Procedures Procedures (including critical care time)  Medications Ordered in ED Medications - No data to display   Initial Impression / Assessment and Plan / ED Course  I have reviewed the triage vital signs and the nursing notes.  Pertinent labs & imaging results that were available during my care of the patient were reviewed by me and considered in my medical decision making (see chart for details).     Chest pain is quite atypical.  Given continuous symptoms since yesterday and a negative troponin and benign ECG I highly doubt ACS and do not think further workup needed.  This is been an on and off problem for quite some time.  Basic lab work is otherwise unremarkable including renal function and electrolytes.  Her headache has been an on and off problem for several months as well.  However she does not have a daily headache or have other concerning symptoms such as vomiting, weakness/numbness, or blurry vision.  Thus I do not think emergent imaging needed.  As for her  blood pressure it is fluctuating but not severely in the ED.  I do not think emergent blood pressure management is needed and I think she can follow-up with PCP for outpatient blood pressure control.  She does have a PCP, encouraged to follow-up closely and we discussed strict return precautions.  Final Clinical Impressions(s) / ED Diagnoses   Final diagnoses:  Atypical chest pain  Frontal headache    ED Discharge Orders    None       Sherwood Gambler, MD 01/29/18 1303

## 2018-01-29 NOTE — ED Triage Notes (Signed)
Pt c/o HAs for a few weeks and her family suggested it could be r/t elevated BP; chest pressure accompanies HAs; checked BP at [facility] - 149/99

## 2018-02-05 ENCOUNTER — Emergency Department (HOSPITAL_BASED_OUTPATIENT_CLINIC_OR_DEPARTMENT_OTHER)
Admission: EM | Admit: 2018-02-05 | Discharge: 2018-02-05 | Disposition: A | Discharge status: Home / Self Care | Payer: Self-pay | Attending: Emergency Medicine | Admitting: Emergency Medicine

## 2018-02-05 ENCOUNTER — Other Ambulatory Visit: Payer: Self-pay

## 2018-02-05 ENCOUNTER — Encounter (HOSPITAL_BASED_OUTPATIENT_CLINIC_OR_DEPARTMENT_OTHER): Payer: Self-pay | Admitting: Emergency Medicine

## 2018-02-05 DIAGNOSIS — R002 Palpitations: Secondary | ICD-10-CM | POA: Insufficient documentation

## 2018-02-05 DIAGNOSIS — E876 Hypokalemia: Secondary | ICD-10-CM | POA: Insufficient documentation

## 2018-02-05 DIAGNOSIS — F17210 Nicotine dependence, cigarettes, uncomplicated: Secondary | ICD-10-CM | POA: Insufficient documentation

## 2018-02-05 LAB — URINALYSIS, ROUTINE W REFLEX MICROSCOPIC
BILIRUBIN URINE: NEGATIVE
Glucose, UA: NEGATIVE mg/dL
Ketones, ur: NEGATIVE mg/dL
Leukocytes, UA: NEGATIVE
Nitrite: NEGATIVE
PROTEIN: NEGATIVE mg/dL
Specific Gravity, Urine: <1.005 — ABNORMAL LOW (ref 1.005–1.030)
pH: 5.5 (ref 5.0–8.0)

## 2018-02-05 LAB — BASIC METABOLIC PANEL
ANION GAP: 10 (ref 5–15)
BUN: 10 mg/dL (ref 6–20)
CO2: 24 mmol/L (ref 22–32)
CREATININE: 0.69 mg/dL (ref 0.44–1.00)
Calcium: 9 mg/dL (ref 8.9–10.3)
Chloride: 104 mmol/L (ref 101–111)
GFR calc non Af Amer: >60 mL/min (ref >60)
Glucose, Bld: 144 mg/dL — ABNORMAL HIGH (ref 65–99)
Potassium: 3 mmol/L — ABNORMAL LOW (ref 3.5–5.1)
SODIUM: 138 mmol/L (ref 135–145)

## 2018-02-05 LAB — CBC WITH DIFFERENTIAL/PLATELET
BASOS ABS: 0 10*3/uL (ref 0.0–0.1)
BASOS PCT: 0 %
Eosinophils Absolute: 0 10*3/uL (ref 0.0–0.7)
Eosinophils Relative: 0 %
HCT: 36.5 % (ref 36.0–46.0)
HEMOGLOBIN: 12.3 g/dL (ref 12.0–15.0)
Lymphocytes Relative: 26 %
Lymphs Abs: 2.4 10*3/uL (ref 0.7–4.0)
MCH: 28.7 pg (ref 26.0–34.0)
MCHC: 33.7 g/dL (ref 30.0–36.0)
MCV: 85.3 fL (ref 78.0–100.0)
MONO ABS: 0.4 10*3/uL (ref 0.1–1.0)
MONOS PCT: 5 %
NEUTROS PCT: 69 %
Neutro Abs: 6.3 10*3/uL (ref 1.7–7.7)
Platelets: 379 10*3/uL (ref 150–400)
RBC: 4.28 MIL/uL (ref 3.87–5.11)
RDW: 13 % (ref 11.5–15.5)
WBC: 9.2 10*3/uL (ref 4.0–10.5)

## 2018-02-05 LAB — URINALYSIS, MICROSCOPIC (REFLEX)

## 2018-02-05 LAB — TROPONIN I

## 2018-02-05 MED ORDER — POTASSIUM CHLORIDE CRYS ER 20 MEQ PO TBCR: 20.0000 meq | EXTENDED_RELEASE_TABLET | Freq: Every day | ORAL | 0 refills | Status: AC

## 2018-02-05 MED ORDER — POTASSIUM CHLORIDE CRYS ER 20 MEQ PO TBCR
40.0000 meq | EXTENDED_RELEASE_TABLET | Freq: Once | ORAL | Status: AC
  Administered 2018-02-05: 40 meq via ORAL
  Filled 2018-02-05: qty 2

## 2018-02-05 NOTE — ED Triage Notes (Signed)
Pt reports awaking from sleep with rapid heart beat. Pt reports dizziness all day yesterday.

## 2018-02-05 NOTE — ED Provider Notes (Signed)
[facility] Provider Note: Georgena Spurling, MD, FACEP  CSN: [id number] MRN: [id number] ARRIVAL: 02/05/18 at [place]: [place]  Palpitations   HISTORY OF PRESENT ILLNESS  02/05/18 4:19 AM Stacey Weeks is a 42 y.o. female who awoke from sleep just prior to arrival with her heart racing.  It resolved prior to arrival.  There was no associated chest pain, shortness of breath, diaphoresis or nausea.  She did have a headache which she describes as pounding in her head.  She also had a dry mouth.  She states she was lightheaded throughout the day yesterday but did not have the rapid heartbeat.  She was awakened yesterday morning with similar symptoms.   Past Medical History:  Diagnosis Date  . Active smoker   . Anemia   . Blood transfusion 2000  . Blood transfusion without reported diagnosis   . Chronic pelvic pain in female   . Endometriosis   . Fibroid, uterus   . [id number](093.2)     Past Surgical History:  Procedure Laterality Date  . ABDOMINAL HYSTERECTOMY  12/11/2011   Procedure: HYSTERECTOMY ABDOMINAL;  Surgeon: Margarette Asal, MD;  Location: [facility];  Service: Gynecology;  Laterality: N/A;  . CYSTOSCOPY    . GANGLION CYST EXCISION  age 69   right wrist  . HYSTEROSCOPY W/D&C  10-26-10   polypectomy  . INCISION AND DRAINAGE OF WOUND Left 08/01/2016   Procedure: IRRIGATION AND DEBRIDEMENT WOUND;  Surgeon: Leanora Cover, MD;  Location: [facility];  Service: Orthopedics;  Laterality: Left;  . LAPAROSCOPY  10/01/2011   Procedure: LAPAROSCOPY DIAGNOSTIC;  Surgeon: Margarette Asal;  Location: [facility];  Service: Gynecology;  Laterality: N/A;  . SALPINGOOPHORECTOMY  12/11/2011   Procedure: SALPINGO OOPHERECTOMY;  Surgeon: Margarette Asal, MD;  Location: [facility];  Service: Gynecology;  Laterality: Bilateral;    Family History  Problem Relation Age of Onset  . Diabetes Maternal Grandmother   . Hypertension  Maternal Grandmother   . Heart disease Maternal Grandmother     Social History   Tobacco Use  . Smoking status: Current Every Day Smoker    Packs/day: 0.50    Years: 6.00    Pack years: 3.00    Types: Cigarettes  . Smokeless tobacco: Never Used  Substance Use Topics  . Alcohol use: No  . Drug use: No    Prior to Admission medications   Not on File    Allergies Percocet [oxycodone-acetaminophen]   REVIEW OF SYSTEMS  Negative except as noted here or in the History of Present Illness.   PHYSICAL EXAMINATION  Initial Vital Signs Blood pressure (!) 142/83, pulse 88, temperature 98.1 F (36.7 C), temperature source Oral, resp. rate 16, height 5\' 7"  (1.702 m), weight 99.8 kg (220 lb), last menstrual period 09/15/2011, SpO2 100 %.  Examination General: Well-developed, well-nourished female in no acute distress; appearance consistent with age of record HENT: normocephalic; atraumatic Eyes: pupils equal, round and reactive to light; extraocular muscles intact Neck: supple Heart: regular rate and rhythm; no ectopy Lungs: clear to auscultation bilaterally Abdomen: soft; nondistended; nontender; bowel sounds present Extremities: No deformity; full range of motion; pulses normal Neurologic: Awake, alert and oriented; motor function intact in all extremities and symmetric; no facial droop Skin: Warm and dry Psychiatric: Flat affect   RESULTS  Summary of this visit's results, reviewed by myself:   EKG Interpretation  Date/Time:  Thursday February 05 2018 04:14:20 EDT Ventricular  Rate:  86 PR Interval:    QRS Duration: 95 QT Interval:  389 QTC Calculation: 466 R Axis:   41 Text Interpretation:  Sinus rhythm Normal ECG No significant change was found Confirmed by Shanon Rosser [phone]) on 02/05/2018 4:18:45 AM      Laboratory Studies: Results for orders placed or performed during the hospital encounter of 02/05/18 (from the past 24 hour(s))  Urinalysis, Routine w reflex  microscopic     Status: Abnormal   Collection Time: 02/05/18  4:45 AM  Result Value Ref Range   Color, Urine YELLOW YELLOW   APPearance CLEAR CLEAR   Specific Gravity, Urine <1.005 (L) 1.005 - 1.030   pH 5.5 5.0 - 8.0   Glucose, UA NEGATIVE NEGATIVE mg/dL   Hgb urine dipstick SMALL (A) NEGATIVE   Bilirubin Urine NEGATIVE NEGATIVE   Ketones, ur NEGATIVE NEGATIVE mg/dL   Protein, ur NEGATIVE NEGATIVE mg/dL   Nitrite NEGATIVE NEGATIVE   Leukocytes, UA NEGATIVE NEGATIVE  CBC with Differential/Platelet     Status: None   Collection Time: 02/05/18  4:45 AM  Result Value Ref Range   WBC 9.2 4.0 - 10.5 K/uL   RBC 4.28 3.87 - 5.11 MIL/uL   Hemoglobin 12.3 12.0 - 15.0 g/dL   HCT 36.5 36.0 - 46.0 %   MCV 85.3 78.0 - 100.0 fL   MCH 28.7 26.0 - 34.0 pg   MCHC 33.7 30.0 - 36.0 g/dL   RDW 13.0 11.5 - 15.5 %   Platelets 379 150 - 400 K/uL   Neutrophils Relative % 69 %   Neutro Abs 6.3 1.7 - 7.7 K/uL   Lymphocytes Relative 26 %   Lymphs Abs 2.4 0.7 - 4.0 K/uL   Monocytes Relative 5 %   Monocytes Absolute 0.4 0.1 - 1.0 K/uL   Eosinophils Relative 0 %   Eosinophils Absolute 0.0 0.0 - 0.7 K/uL   Basophils Relative 0 %   Basophils Absolute 0.0 0.0 - 0.1 K/uL  Basic metabolic panel     Status: Abnormal   Collection Time: 02/05/18  4:45 AM  Result Value Ref Range   Sodium 138 135 - 145 mmol/L   Potassium 3.0 (L) 3.5 - 5.1 mmol/L   Chloride 104 101 - 111 mmol/L   CO2 24 22 - 32 mmol/L   Glucose, Bld 144 (H) 65 - 99 mg/dL   BUN 10 6 - 20 mg/dL   Creatinine, Ser 0.69 0.44 - 1.00 mg/dL   Calcium 9.0 8.9 - 10.3 mg/dL   GFR calc non Af Amer >60 >60 mL/min   GFR calc Af Amer >60 >60 mL/min   Anion gap 10 5 - 15  Troponin I     Status: None   Collection Time: 02/05/18  4:45 AM  Result Value Ref Range   Troponin I <0.03 <0.03 ng/mL  Urinalysis, Microscopic (reflex)     Status: Abnormal   Collection Time: 02/05/18  4:45 AM  Result Value Ref Range   RBC / HPF 0-5 0 - 5 RBC/hpf   WBC, UA 0-5 0  - 5 WBC/hpf   Bacteria, UA FEW (A) NONE SEEN   Squamous Epithelial / LPF 6-30 (A) NONE SEEN   Imaging Studies: No results found.  ED COURSE  Nursing notes and initial vitals signs, including pulse oximetry, reviewed.  Vitals:   02/05/18 0445 02/05/18 0500 02/05/18 0515 02/05/18 0530  BP:  121/68    Pulse: 83 84 81 83  Resp: 20 18 19 18   Temp:  TempSrc:      SpO2: 100% 99% 98% 100%  Weight:      Height:       5:48 AM No arrhythmias seen in ED.  She may have had some runs of supraventricular tachycardia or other arrhythmia.  Her potassium is low we will replete this.  We will refer to cardiology for possible Holter monitoring.  PROCEDURES    ED DIAGNOSES     ICD-10-CM   1. Palpitations R00.2   2. Hypokalemia E87.6        Johann Gascoigne, Jenny Reichmann, MD 02/05/18 [phone]

## 2018-02-05 NOTE — ED Notes (Signed)
Attempted IV access in left forearm - vein collapsed - pressure applied with gauze dressing. Pt refusing 2nd IV attempt. Pt reports she continues to feel like her heart is racing, however, she is in a Sinus Rhythm at a rate of 80 on the cardiac monitor.

## 2018-02-13 DIAGNOSIS — R002 Palpitations: Secondary | ICD-10-CM

## 2018-02-13 HISTORY — DX: Palpitations: R00.2

## 2018-02-15 DIAGNOSIS — E876 Hypokalemia: Secondary | ICD-10-CM | POA: Insufficient documentation

## 2018-02-15 NOTE — Progress Notes (Deleted)
Cardiology Office Note:    Date:  02/15/2018   ID:  Stacey Weeks, DOB [birth year], MRN [id number]  PCP:  Patient, No Pcp Per  Cardiologist:  Shirlee More, MD   Referring MD: No ref. provider found  ASSESSMENT:    No diagnosis found. PLAN:    In order of problems listed above:  1. ***  Next appointment   Medication Adjustments/Labs and Tests Ordered: Current medicines are reviewed at length with the patient today.  Concerns regarding medicines are outlined above.  No orders of the defined types were placed in this encounter.  No orders of the defined types were placed in this encounter.    No chief complaint on file. ***  History of Present Illness:    Stacey Weeks is a 42 y.o. female who is being seen today for the evaluation of palpitation at the request of the patient  Past Medical History:  Diagnosis Date  . Active smoker   . Anemia   . Blood transfusion 2000  . Blood transfusion without reported diagnosis   . Chronic pelvic pain in female   . Endometriosis   . Fibroid, uterus   . Headache(784.0)   . Palpitations 02/13/2018    Past Surgical History:  Procedure Laterality Date  . ABDOMINAL HYSTERECTOMY  12/11/2011   Procedure: HYSTERECTOMY ABDOMINAL;  Surgeon: Margarette Asal, MD;  Location: [facility];  Service: Gynecology;  Laterality: N/A;  . CYSTOSCOPY    . GANGLION CYST EXCISION  age 76   right wrist  . HYSTEROSCOPY W/D&C  10-26-10   polypectomy  . INCISION AND DRAINAGE OF WOUND Left 08/01/2016   Procedure: IRRIGATION AND DEBRIDEMENT WOUND;  Surgeon: Leanora Cover, MD;  Location: [facility];  Service: Orthopedics;  Laterality: Left;  . LAPAROSCOPY  10/01/2011   Procedure: LAPAROSCOPY DIAGNOSTIC;  Surgeon: Margarette Asal;  Location: [facility];  Service: Gynecology;  Laterality: N/A;  . SALPINGOOPHORECTOMY  12/11/2011   Procedure: SALPINGO OOPHERECTOMY;  Surgeon: Margarette Asal, MD;  Location: [facility];  Service:  Gynecology;  Laterality: Bilateral;    Current Medications: No outpatient medications have been marked as taking for the 02/16/18 encounter (Appointment) with Richardo Priest, MD.     Allergies:   Penicillin g and Percocet [oxycodone-acetaminophen]   Social History   Socioeconomic History  . Marital status: Single    Spouse name: Not on file  . Number of children: Not on file  . Years of education: Not on file  . Highest education level: Not on file  Occupational History  . Not on file  Social Needs  . Financial resource strain: Not on file  . Food insecurity:    Worry: Not on file    Inability: Not on file  . Transportation needs:    Medical: Not on file    Non-medical: Not on file  Tobacco Use  . Smoking status: Current Every Day Smoker    Packs/day: 0.50    Years: 6.00    Pack years: 3.00    Types: Cigarettes  . Smokeless tobacco: Never Used  Substance and Sexual Activity  . Alcohol use: No  . Drug use: No  . Sexual activity: Never    Birth control/protection: Surgical  Lifestyle  . Physical activity:    Days per week: Not on file    Minutes per session: Not on file  . Stress: Not on file  Relationships  . Social connections:    Talks on phone: Not  on file    Gets together: Not on file    Attends religious service: Not on file    Active member of club or organization: Not on file    Attends meetings of clubs or organizations: Not on file    Relationship status: Not on file  Other Topics Concern  . Not on file  Social History Narrative  . Not on file     Family History: The patient's ***family history includes Diabetes in her maternal grandmother; Heart disease in her maternal grandmother; Hypertension in her maternal grandmother.  ROS:   ROS Please see the history of present illness.    *** All other systems reviewed and are negative.  EKGs/Labs/Other Studies Reviewed:    The following studies were reviewed today: *** EKG  Interpretation Date/Time:                        Thursday February 05 2018 04:14:20 EDT Ventricular Rate:   86 PR Interval:                        QRS Duration:        95 QT Interval:                      389 QTC Calculation:    466 R Axis:                         41 Text Interpretation:  Sinus rhythm Normal ECG No significant change was found Confirmed by Molpus, John [phone]) on 02/05/2018 4:18:45 AM  EKG:  EKG is *** ordered today.  The ekg ordered today demonstrates ***  Recent Labs:   Troponin undetectable 02/05/2018: BUN 10; Creatinine, Ser 0.69; Hemoglobin 12.3; Platelets 379; Potassium 3.0; Sodium 138  Recent Lipid Panel No results found for: CHOL, TRIG, HDL, CHOLHDL, VLDL, LDLCALC, LDLDIRECT  Physical Exam:    VS:  LMP 09/15/2011     Wt Readings from Last 3 Encounters:  02/05/18 220 lb (99.8 kg)  01/29/18 220 lb (99.8 kg)  12/28/17 224 lb (101.6 kg)     GEN: *** Well nourished, well developed in no acute distress HEENT: Normal NECK: No JVD; No carotid bruits LYMPHATICS: No lymphadenopathy CARDIAC: ***RRR, no murmurs, rubs, gallops RESPIRATORY:  Clear to auscultation without rales, wheezing or rhonchi  ABDOMEN: Soft, non-tender, non-distended MUSCULOSKELETAL:  No edema; No deformity  SKIN: Warm and dry NEUROLOGIC:  Alert and oriented x 3 PSYCHIATRIC:  Normal affect     Signed, Shirlee More, MD  02/15/2018 8:46 PM    [place]

## 2018-02-16 ENCOUNTER — Ambulatory Visit: Payer: Self-pay | Admitting: Cardiology

## 2018-09-15 ENCOUNTER — Other Ambulatory Visit: Payer: Self-pay

## 2018-09-15 ENCOUNTER — Emergency Department (HOSPITAL_BASED_OUTPATIENT_CLINIC_OR_DEPARTMENT_OTHER)
Admission: EM | Admit: 2018-09-15 | Discharge: 2018-09-15 | Disposition: A | Discharge status: Home / Self Care | Payer: Self-pay | Attending: Emergency Medicine | Admitting: Emergency Medicine

## 2018-09-15 ENCOUNTER — Encounter (HOSPITAL_BASED_OUTPATIENT_CLINIC_OR_DEPARTMENT_OTHER): Payer: Self-pay

## 2018-09-15 DIAGNOSIS — F17210 Nicotine dependence, cigarettes, uncomplicated: Secondary | ICD-10-CM | POA: Insufficient documentation

## 2018-09-15 DIAGNOSIS — Z79899 Other long term (current) drug therapy: Secondary | ICD-10-CM | POA: Insufficient documentation

## 2018-09-15 DIAGNOSIS — R0981 Nasal congestion: Secondary | ICD-10-CM | POA: Insufficient documentation

## 2018-09-15 DIAGNOSIS — E876 Hypokalemia: Secondary | ICD-10-CM

## 2018-09-15 DIAGNOSIS — I10 Essential (primary) hypertension: Secondary | ICD-10-CM | POA: Insufficient documentation

## 2018-09-15 DIAGNOSIS — R42 Dizziness and giddiness: Secondary | ICD-10-CM

## 2018-09-15 DIAGNOSIS — J329 Chronic sinusitis, unspecified: Secondary | ICD-10-CM

## 2018-09-15 HISTORY — DX: Essential (primary) hypertension: I10

## 2018-09-15 LAB — URINALYSIS, ROUTINE W REFLEX MICROSCOPIC
BILIRUBIN URINE: NEGATIVE
Glucose, UA: NEGATIVE mg/dL
KETONES UR: NEGATIVE mg/dL
LEUKOCYTES UA: NEGATIVE
Nitrite: NEGATIVE
PH: 6.5 (ref 5.0–8.0)
Protein, ur: NEGATIVE mg/dL
SPECIFIC GRAVITY, URINE: 1.015 (ref 1.005–1.030)

## 2018-09-15 LAB — URINALYSIS, MICROSCOPIC (REFLEX)

## 2018-09-15 LAB — BASIC METABOLIC PANEL
Anion gap: 9 (ref 5–15)
BUN: 14 mg/dL (ref 6–20)
CALCIUM: 9.4 mg/dL (ref 8.9–10.3)
CO2: 28 mmol/L (ref 22–32)
CREATININE: 0.76 mg/dL (ref 0.44–1.00)
Chloride: 102 mmol/L (ref 98–111)
GFR calc Af Amer: >60 mL/min (ref >60)
GFR calc non Af Amer: >60 mL/min (ref >60)
GLUCOSE: 103 mg/dL — AB (ref 70–99)
Potassium: 3.1 mmol/L — ABNORMAL LOW (ref 3.5–5.1)
Sodium: 139 mmol/L (ref 135–145)

## 2018-09-15 LAB — CBC WITH DIFFERENTIAL/PLATELET
Abs Immature Granulocytes: 0.02 10*3/uL (ref 0.00–0.07)
Basophils Absolute: 0 10*3/uL (ref 0.0–0.1)
Basophils Relative: 0 %
EOS ABS: 0.1 10*3/uL (ref 0.0–0.5)
Eosinophils Relative: 1 %
HEMATOCRIT: 40.9 % (ref 36.0–46.0)
HEMOGLOBIN: 13.1 g/dL (ref 12.0–15.0)
IMMATURE GRANULOCYTES: 0 %
LYMPHS PCT: 39 %
Lymphs Abs: 3.8 10*3/uL (ref 0.7–4.0)
MCH: 28.3 pg (ref 26.0–34.0)
MCHC: 32 g/dL (ref 30.0–36.0)
MCV: 88.3 fL (ref 80.0–100.0)
MONOS PCT: 8 %
Monocytes Absolute: 0.7 10*3/uL (ref 0.1–1.0)
NEUTROS PCT: 52 %
Neutro Abs: 5 10*3/uL (ref 1.7–7.7)
Platelets: 359 10*3/uL (ref 150–400)
RBC: 4.63 MIL/uL (ref 3.87–5.11)
RDW: 13 % (ref 11.5–15.5)
WBC: 9.6 10*3/uL (ref 4.0–10.5)
nRBC: 0 % (ref 0.0–0.2)

## 2018-09-15 LAB — TROPONIN I: Troponin I: <0.03 ng/mL (ref <0.03)

## 2018-09-15 LAB — CBG MONITORING, ED: Glucose-Capillary: 168 mg/dL — ABNORMAL HIGH (ref 70–99)

## 2018-09-15 MED ORDER — POTASSIUM CHLORIDE CRYS ER 20 MEQ PO TBCR
40.0000 meq | EXTENDED_RELEASE_TABLET | Freq: Once | ORAL | Status: AC
  Administered 2018-09-15: 40 meq via ORAL
  Filled 2018-09-15: qty 2

## 2018-09-15 MED ORDER — MECLIZINE HCL 25 MG PO TABS: 25.0000 mg | ORAL_TABLET | Freq: Three times a day (TID) | ORAL | 0 refills | Status: AC | PRN

## 2018-09-15 MED ORDER — SODIUM CHLORIDE 0.9 % IV BOLUS
1000.0000 mL | Freq: Once | INTRAVENOUS | Status: AC
  Administered 2018-09-15: 1000 mL via INTRAVENOUS

## 2018-09-15 MED ORDER — POTASSIUM CHLORIDE ER 10 MEQ PO TBCR: 40.0000 meq | EXTENDED_RELEASE_TABLET | Freq: Every day | ORAL | 0 refills | Status: AC

## 2018-09-15 NOTE — Discharge Instructions (Addendum)
You were seen in the ER for light-headedness.  Work up today was reassuring. Your potassium was slightly low this is probably for medications you take.   Take potassium supplements as prescribed.  Stay well hydrated.    Light-headedness may be exacerbated by vertigo from chronic sinus congestion.  Use daily allergy medication (zyrtec, allegra, etc), flonase nasal spray and sudafed.  Additionally, take meclizine for the next 3 days to help with any possible vertigo symptoms and dizziness.   Return to ER for worsening or new symptoms, chest pain or shortness of breath with exertion, headache, vision changes, difficulty walking or talking, stroke symptoms such as numbness or weakness to extremities, facial drooping

## 2018-09-15 NOTE — ED Provider Notes (Signed)
[facility] EMERGENCY DEPARTMENT Provider Note   CSN: [id number] Arrival date & time: 09/15/18  1333     History   Chief Complaint Chief Complaint  Patient presents with  . Dizziness    HPI Stacey Weeks is a 42 y.o. female h/o HTN, tobacco use, chronic sinus congestion is here for dizziness, describes dizziness as feeling like she was going to pass out, and sensation of things looking "shaky".  She had two episodes of dizziness lasting 40-45 mins.  Onset yesterday, one episode yesterday and one today.  Both episodes occurred while she was sitting down.  Her face broke out in sweats.  Other associated with shortness of breath at rest and with laying down, feels like there is something in her throat.  SOB is not exertional states she has had sensation of something in her throat for months.  She had one episode of right shoulder pain and right sided head pain this morning without dizziness.  Since, has had constant right shoulder pain, not worse with exertion, movement, breathing, non radiating.  In between episodes she feels at baseline.  She was afraid to get up yesterday because she thought she was going to fall.   No associated headache, vision changes, CP, cough, fevers, abdominal pain, back pain, nausea, vomiting, diarrhea, blood in stools.  Take 4 stool softeners for constipation daily.   HPI  Past Medical History:  Diagnosis Date  . Active smoker   . Anemia   . Blood transfusion 2000  . Blood transfusion without reported diagnosis   . Chronic pelvic pain in female   . Endometriosis   . Fibroid, uterus   . Headache(784.0)   . Hypertension   . Palpitations 02/13/2018    Patient Active Problem List   Diagnosis Date Noted  . Hypokalemia 02/15/2018  . Palpitations 02/13/2018  . Allergy to pollen 09/17/2017  . Anosmia 09/17/2017  . Cigarette smoker 09/17/2017  . Laceration of left index finger with foreign body and damage to nail 08/07/2016  . Open  nondisplaced fracture of distal phalanx of left index finger with routine healing 08/07/2016  . Nicotine use disorder 09/13/2015  . Chronic low back pain 06/06/2015  . Degeneration, intervertebral disc, cervical 06/06/2015  . Neck pain 06/06/2015  . Pain in joint 06/06/2015  . History of hysterectomy 03/28/2015    Past Surgical History:  Procedure Laterality Date  . ABDOMINAL HYSTERECTOMY  12/11/2011   Procedure: HYSTERECTOMY ABDOMINAL;  Surgeon: Margarette Asal, MD;  Location: [facility];  Service: Gynecology;  Laterality: N/A;  . CYSTOSCOPY    . GANGLION CYST EXCISION  age 64   right wrist  . HYSTEROSCOPY W/D&C  10-26-10   polypectomy  . INCISION AND DRAINAGE OF WOUND Left 08/01/2016   Procedure: IRRIGATION AND DEBRIDEMENT WOUND;  Surgeon: Leanora Cover, MD;  Location: [facility];  Service: Orthopedics;  Laterality: Left;  . LAPAROSCOPY  10/01/2011   Procedure: LAPAROSCOPY DIAGNOSTIC;  Surgeon: Margarette Asal;  Location: [facility];  Service: Gynecology;  Laterality: N/A;  . SALPINGOOPHORECTOMY  12/11/2011   Procedure: SALPINGO OOPHERECTOMY;  Surgeon: Margarette Asal, MD;  Location: [facility];  Service: Gynecology;  Laterality: Bilateral;     OB History    Gravida  0   Para      Term      Preterm      AB      Living        SAB  TAB      Ectopic      Multiple      Live Births               Home Medications    Prior to Admission medications   Medication Sig Start Date End Date Taking? Authorizing Provider  meclizine (ANTIVERT) 25 MG tablet Take 1 tablet (25 mg total) by mouth 3 (three) times daily as needed for dizziness. 09/15/18   Kinnie Feil, PA-C  potassium chloride (K-DUR) 10 MEQ tablet Take 4 tablets (40 mEq total) by mouth daily for 7 days. 09/15/18 09/22/18  Kinnie Feil, PA-C  potassium chloride SA (K-DUR,KLOR-CON) 20 MEQ tablet Take 1 tablet (20 mEq total) by mouth daily. 02/05/18   Molpus, John, MD     Family History Family History  Problem Relation Age of Onset  . Diabetes Maternal Grandmother   . Hypertension Maternal Grandmother   . Heart disease Maternal Grandmother     Social History Social History   Tobacco Use  . Smoking status: Current Every Day Smoker    Packs/day: 0.50    Years: 6.00    Pack years: 3.00    Types: Cigarettes  . Smokeless tobacco: Never Used  Substance Use Topics  . Alcohol use: No  . Drug use: No     Allergies   Penicillin g and Percocet [oxycodone-acetaminophen]   Review of Systems Review of Systems  Constitutional: Positive for chills.  HENT: Positive for congestion (chronic) and sinus pressure.   Respiratory: Positive for shortness of breath.   Cardiovascular: Positive for chest pain and palpitations.  Neurological: Positive for light-headedness.  All other systems reviewed and are negative.    Physical Exam Updated Vital Signs BP 111/67   Pulse 70   Temp 98.4 F (36.9 C) (Oral)   Resp 17   Ht 5\' 7"  (1.702 m)   Wt 100.7 kg   LMP 09/15/2011   SpO2 99%   BMI 34.77 kg/m   Physical Exam  Constitutional: She appears well-developed and well-nourished.  NAD. Non toxic.   HENT:  Head: Normocephalic and atraumatic.  Right Ear: External ear normal.  Left Ear: External ear normal.  Nose: Nose normal.  Mouth/Throat: Oropharynx is clear and moist.  Cloudy TMs bilaterally R>L. Mild bilateral nasal mucosal edema. No sinus tenderness.   Eyes: Conjunctivae are normal.  Neck: Normal range of motion.  Cardiovascular: Normal rate, regular rhythm and normal heart sounds.  2+ DP and radial pulses bilaterally. No LE edema or calf tenderness.   Pulmonary/Chest: Effort normal and breath sounds normal.  Abdominal: Soft. There is no tenderness.  Musculoskeletal: Normal range of motion.  Neurological:  Speech is fluent without obvious dysarthria or aphasia. Strength 5/5 in upper and lower extremities   Sensation to light touch intact  in bilateral face, upper and lower extremities Normal gait. No pronator drift. No leg drop.  Normal finger-to-nose and finger tapping.  CN II-XII grossly intact bilaterally.   Hints exam: Saccade correction back midline.  No nystagmus. Normal vertical eye alignment   Skin: Skin is warm and dry. Capillary refill takes less than 2 seconds.  Psychiatric: She has a normal mood and affect. Her behavior is normal.     ED Treatments / Results  Labs (all labs ordered are listed, but only abnormal results are displayed) Labs Reviewed  BASIC METABOLIC PANEL - Abnormal; Notable for the following components:      Result Value   Potassium 3.1 (*)  Glucose, Bld 103 (*)    All other components within normal limits  URINALYSIS, ROUTINE W REFLEX MICROSCOPIC - Abnormal; Notable for the following components:   Hgb urine dipstick TRACE (*)    All other components within normal limits  URINALYSIS, MICROSCOPIC (REFLEX) - Abnormal; Notable for the following components:   Bacteria, UA MANY (*)    All other components within normal limits  CBG MONITORING, ED - Abnormal; Notable for the following components:   Glucose-Capillary 168 (*)    All other components within normal limits  CBC WITH DIFFERENTIAL/PLATELET  TROPONIN I  TROPONIN I    EKG EKG Interpretation  Date/Time:  Tuesday September 15 2018 13:45:58 EDT Ventricular Rate:  86 PR Interval:  136 QRS Duration: 94 QT Interval:  348 QTC Calculation: 416 R Axis:   61 Text Interpretation:  Normal sinus rhythm T wave abnormality, consider inferior ischemia Abnormal ECG inferior T wave inversions are more pronounced but are present on previous tracing Confirmed by Theotis Burrow [phone]) on 09/15/2018 5:46:02 PM   Radiology No results found.  Procedures Procedures (including critical care time)  Medications Ordered in ED Medications  sodium chloride 0.9 % bolus 1,000 mL ( Intravenous Stopped 09/15/18 1705)  potassium chloride SA  (K-DUR,KLOR-CON) CR tablet 40 mEq (40 mEq Oral Given 09/15/18 1742)     Initial Impression / Assessment and Plan / ED Course  I have reviewed the triage vital signs and the nursing notes.  Pertinent labs & imaging results that were available during my care of the patient were reviewed by me and considered in my medical decision making (see chart for details).    42 yo F presents with two 45 min episodes of light-headedness.  She reports multiple other complaints including facial sweating, SOB at rest (non exertional), sensation of something in her throat, right shoulder pain, right sided head pain.  No CP, palpitations, infectious symptoms.  Exam is reassuring.   Considering lightheadedness from orthostasis from mild dehydration. She states she does not drink a lot of water daily.  She is not describing vertiginous symptoms however has chronic sinus congestion, cloudy TMs and describes things looking "shaky" so peripheral vertigo on differential.  Doubt central cause of dizziness, neuro exam is normal, hiints exam normal.  No history of anemia.  Will assess for cardiac etiology/arrthythmia. She is on monitor. Of note, she had cardiac work up for palpitations 6 months ago with normal halter monitor.  Cardiac RF include age, tobacco, obesity, HTN.   1745: trop 0.03. EKG with slightly more prominent TWI in inferior leads but she is CP free, and symptomatology sounds very atypical. Hgb WNL. K 3.1, currently on HCTZ, will replete orally. HEART score =3. PERC negative.   1945: delta trop flat.  UA with many bacteria but she is asymptomatic, will send for culture. H/o hysterectomy she refused urine pregnancy. She has been in ER for >5 hrs asymptomatic.  Discussed work up with pt. Given reassuring ER work up, low heart score, PERC negative will dc with f/u with cardiology/PCP for further evaluation. Will recommend flonase, zyrtec for chronic sinus congestion.  Will give short course of meclizine in case  vertiginous symptoms contributing.   Final Clinical Impressions(s) / ED Diagnoses   Final diagnoses:  Light headed  Hypokalemia  Chronic congestion of paranasal sinus    ED Discharge Orders         Ordered    potassium chloride (K-DUR) 10 MEQ tablet  Daily     09/15/18  1925    meclizine (ANTIVERT) 25 MG tablet  3 times daily PRN     09/15/18 1927           Kinnie Feil, PA-C 09/15/18 1953    Little, Wenda Overland, MD 09/15/18 [phone]

## 2018-09-15 NOTE — ED Triage Notes (Addendum)
C/o dizziness, SOB, sweats started 5pm yesterday-pain to right side of head and right shoulder today-NAD-steady gait

## 2020-12-02 ENCOUNTER — Emergency Department (HOSPITAL_BASED_OUTPATIENT_CLINIC_OR_DEPARTMENT_OTHER)
Admission: EM | Admit: 2020-12-02 | Discharge: 2020-12-02 | Disposition: A | Discharge status: Home / Self Care | Payer: Medicaid Other | Attending: Emergency Medicine | Admitting: Emergency Medicine

## 2020-12-02 ENCOUNTER — Emergency Department (HOSPITAL_BASED_OUTPATIENT_CLINIC_OR_DEPARTMENT_OTHER): Payer: Medicaid Other

## 2020-12-02 ENCOUNTER — Other Ambulatory Visit: Payer: Self-pay

## 2020-12-02 ENCOUNTER — Encounter (HOSPITAL_BASED_OUTPATIENT_CLINIC_OR_DEPARTMENT_OTHER): Payer: Self-pay | Admitting: Emergency Medicine

## 2020-12-02 DIAGNOSIS — F17210 Nicotine dependence, cigarettes, uncomplicated: Secondary | ICD-10-CM | POA: Insufficient documentation

## 2020-12-02 DIAGNOSIS — L03213 Periorbital cellulitis: Secondary | ICD-10-CM | POA: Insufficient documentation

## 2020-12-02 DIAGNOSIS — I10 Essential (primary) hypertension: Secondary | ICD-10-CM | POA: Insufficient documentation

## 2020-12-02 LAB — BASIC METABOLIC PANEL
Anion gap: 8 (ref 5–15)
BUN: 13 mg/dL (ref 6–20)
CO2: 26 mmol/L (ref 22–32)
Calcium: 9.5 mg/dL (ref 8.9–10.3)
Chloride: 105 mmol/L (ref 98–111)
Creatinine, Ser: 0.73 mg/dL (ref 0.44–1.00)
GFR, Estimated: >60 mL/min (ref >60)
Glucose, Bld: 107 mg/dL — ABNORMAL HIGH (ref 70–99)
Potassium: 3.9 mmol/L (ref 3.5–5.1)
Sodium: 139 mmol/L (ref 135–145)

## 2020-12-02 LAB — CBC WITH DIFFERENTIAL/PLATELET
Abs Immature Granulocytes: 0.01 10*3/uL (ref 0.00–0.07)
Basophils Absolute: 0 10*3/uL (ref 0.0–0.1)
Basophils Relative: 0 %
Eosinophils Absolute: 0.1 10*3/uL (ref 0.0–0.5)
Eosinophils Relative: 1 %
HCT: 42 % (ref 36.0–46.0)
Hemoglobin: 13.9 g/dL (ref 12.0–15.0)
Immature Granulocytes: 0 %
Lymphocytes Relative: 34 %
Lymphs Abs: 2.6 10*3/uL (ref 0.7–4.0)
MCH: 29.5 pg (ref 26.0–34.0)
MCHC: 33.1 g/dL (ref 30.0–36.0)
MCV: 89.2 fL (ref 80.0–100.0)
Monocytes Absolute: 0.8 10*3/uL (ref 0.1–1.0)
Monocytes Relative: 10 %
Neutro Abs: 4.1 10*3/uL (ref 1.7–7.7)
Neutrophils Relative %: 55 %
Platelets: 350 10*3/uL (ref 150–400)
RBC: 4.71 MIL/uL (ref 3.87–5.11)
RDW: 13 % (ref 11.5–15.5)
WBC: 7.5 10*3/uL (ref 4.0–10.5)
nRBC: 0 % (ref 0.0–0.2)

## 2020-12-02 MED ORDER — IOHEXOL 300 MG/ML  SOLN
80.0000 mL | Freq: Once | INTRAMUSCULAR | Status: AC
  Administered 2020-12-02: 80 mL via INTRAVENOUS

## 2020-12-02 MED ORDER — SULFAMETHOXAZOLE-TRIMETHOPRIM 800-160 MG PO TABS: 1.0000 | ORAL_TABLET | Freq: Two times a day (BID) | ORAL | 0 refills | Status: AC

## 2020-12-02 MED ORDER — FLUTICASONE PROPIONATE 50 MCG/ACT NA SUSP: 2.0000 | Freq: Every day | NASAL | 0 refills | Status: AC

## 2020-12-02 NOTE — ED Provider Notes (Signed)
[facility] EMERGENCY DEPARTMENT Provider Note   CSN: [id number] Arrival date & time: 12/02/20  0935    History Chief Complaint  Patient presents with  . Headache    Stacey Weeks is a 45 y.o. female with past medical history significant for hypertension, headaches who presents for evaluation of headache and facial swelling.  Patient states she had a headache yesterday.  She took her blood pressure which she states was elevated around 099 systolic.  Woke up this morning with swelling around her left eye lid. She denies any pain to the eye itself.  No visual changes.  No sudden onset thunderclap headache.  No pain to temples.  Noted that she went to blow her nose on the right side the felt like it was "stuffy."  However denies any congestion or rhinorrhea.  Denies any pain with eye movement.  No rashes or lesions.  No paresthesias, weakness, facial asymmetry, difficulty with word finding.  She had not taken her blood pressure medication yesterday evening or this morning.  Has additional aggravating or alleviating factors. HA from yesterday resolved.  History obtained from patient and past medical records.  No interpreter used  HPI     Past Medical History:  Diagnosis Date  . Active smoker   . Anemia   . Blood transfusion 2000  . Blood transfusion without reported diagnosis   . Chronic pelvic pain in female   . Endometriosis   . Fibroid, uterus   . Headache(784.0)   . Hypertension   . Palpitations 02/13/2018    Patient Active Problem List   Diagnosis Date Noted  . Hypokalemia 02/15/2018  . Palpitations 02/13/2018  . Allergy to pollen 09/17/2017  . Anosmia 09/17/2017  . Cigarette smoker 09/17/2017  . Laceration of left index finger with foreign body and damage to nail 08/07/2016  . Open nondisplaced fracture of distal phalanx of left index finger with routine healing 08/07/2016  . Nicotine use disorder 09/13/2015  . Chronic low back pain 06/06/2015  .  Degeneration, intervertebral disc, cervical 06/06/2015  . Neck pain 06/06/2015  . Pain in joint 06/06/2015  . History of hysterectomy 03/28/2015    Past Surgical History:  Procedure Laterality Date  . ABDOMINAL HYSTERECTOMY  12/11/2011   Procedure: HYSTERECTOMY ABDOMINAL;  Surgeon: Margarette Asal, MD;  Location: [facility];  Service: Gynecology;  Laterality: N/A;  . CYSTOSCOPY    . GANGLION CYST EXCISION  age 29   right wrist  . HYSTEROSCOPY WITH D & C  10-26-10   polypectomy  . INCISION AND DRAINAGE OF WOUND Left 08/01/2016   Procedure: IRRIGATION AND DEBRIDEMENT WOUND;  Surgeon: Leanora Cover, MD;  Location: [facility];  Service: Orthopedics;  Laterality: Left;  . LAPAROSCOPY  10/01/2011   Procedure: LAPAROSCOPY DIAGNOSTIC;  Surgeon: Margarette Asal;  Location: [facility];  Service: Gynecology;  Laterality: N/A;  . SALPINGOOPHORECTOMY  12/11/2011   Procedure: SALPINGO OOPHERECTOMY;  Surgeon: Margarette Asal, MD;  Location: [facility];  Service: Gynecology;  Laterality: Bilateral;     OB History    Gravida  0   Para      Term      Preterm      AB      Living        SAB      IAB      Ectopic      Multiple      Live Births  Family History  Problem Relation Age of Onset  . Diabetes Maternal Grandmother   . Hypertension Maternal Grandmother   . Heart disease Maternal Grandmother     Social History   Tobacco Use  . Smoking status: Current Every Day Smoker    Packs/day: 0.50    Years: 6.00    Pack years: 3.00    Types: Cigarettes  . Smokeless tobacco: Never Used  Vaping Use  . Vaping Use: Never used  Substance Use Topics  . Alcohol use: No  . Drug use: No    Home Medications Prior to Admission medications   Medication Sig Start Date End Date Taking? Authorizing Provider  fluticasone (FLONASE) 50 MCG/ACT nasal spray Place 2 sprays into both nostrils daily. 12/02/20  Yes Rita Vialpando A, PA-C   sulfamethoxazole-trimethoprim (BACTRIM DS) 800-160 MG tablet Take 1 tablet by mouth 2 (two) times daily for 7 days. 12/02/20 12/09/20 Yes Sissi Padia A, PA-C  meclizine (ANTIVERT) 25 MG tablet Take 1 tablet (25 mg total) by mouth 3 (three) times daily as needed for dizziness. 09/15/18   Liberty Handy, PA-C  potassium chloride (K-DUR) 10 MEQ tablet Take 4 tablets (40 mEq total) by mouth daily for 7 days. 09/15/18 09/22/18  Liberty Handy, PA-C  potassium chloride SA (K-DUR,KLOR-CON) 20 MEQ tablet Take 1 tablet (20 mEq total) by mouth daily. 02/05/18   Molpus, Jonny Ruiz, MD    Allergies    Penicillin g and Percocet [oxycodone-acetaminophen]  Review of Systems   Review of Systems  Constitutional: Negative.   HENT: Negative.   Eyes: Negative.        Swelling surrounding left eye lid, Superior> inferior  Respiratory: Negative.   Cardiovascular: Negative.   Gastrointestinal: Negative.   Genitourinary: Negative.   Musculoskeletal: Negative.   Skin: Negative.   Neurological: Positive for headaches. Negative for dizziness, tremors, seizures, syncope, facial asymmetry, speech difficulty, weakness, light-headedness and numbness.  Hematological: Negative.   All other systems reviewed and are negative.   Physical Exam Updated Vital Signs BP (!) 141/83   Pulse 72   Temp 99.3 F (37.4 C) (Oral)   Resp 18   Ht 5' 7.5" (1.715 m)   Wt 95.3 kg   LMP 09/15/2011   SpO2 100%   BMI 32.41 kg/m   Physical Exam Vitals and nursing note reviewed.  Constitutional:      General: She is not in acute distress.    Appearance: She is well-developed and well-nourished.  HENT:     Head: Normocephalic and atraumatic. No raccoon eyes, Battle's sign, abrasion or contusion.     Jaw: There is normal jaw occlusion.     Comments: No tenderness to palpation of temples.  No rashes or lesions.  No tenderness over V5 distribution    Nose:     Comments: Congestion bilaterally without rhinorrhea  Eyes:      Extraocular Movements: Extraocular movements intact.     Conjunctiva/sclera: Conjunctivae normal.     Pupils: Pupils are equal, round, and reactive to light.     Comments: No nystagmus.  Edema surrounding left eye, superior greater than inferior.  No erythema, warmth.  EOMs intact without pain  Cardiovascular:     Rate and Rhythm: Normal rate.     Pulses: Intact distal pulses.  Pulmonary:     Effort: No respiratory distress.  Abdominal:     General: There is no distension.  Musculoskeletal:        General: Normal range of motion.  Cervical back: Normal range of motion.  Skin:    General: Skin is warm and dry.  Neurological:     General: No focal deficit present.     Mental Status: She is alert.     Cranial Nerves: Cranial nerves are intact.     Sensory: Sensation is intact.     Motor: Motor function is intact.     Gait: Gait is intact.     Comments: Mental Status:  Alert, oriented, thought content appropriate. Speech fluent without evidence of aphasia. Able to follow 2 step commands without difficulty.  Cranial Nerves:  II:  Peripheral visual fields grossly normal, pupils equal, round, reactive to light III,IV, VI: ptosis not present, extra-ocular motions intact bilaterally  V,VII: smile symmetric, facial light touch sensation equal VIII: hearing grossly normal bilaterally  IX,X: midline uvula rise  XI: bilateral shoulder shrug equal and strong XII: midline tongue extension  Motor:  5/5 in upper and lower extremities bilaterally including strong and equal grip strength and dorsiflexion/plantar flexion Sensory: Pinprick and light touch normal in all extremities.  Deep Tendon Reflexes: 2+ and symmetric  Cerebellar: normal finger-to-nose with bilateral upper extremities Gait: normal gait and balance CV: distal pulses palpable throughout    Psychiatric:        Mood and Affect: Mood and affect normal.    ED Results / Procedures / Treatments   Labs (all labs ordered are  listed, but only abnormal results are displayed) Labs Reviewed  BASIC METABOLIC PANEL - Abnormal; Notable for the following components:      Result Value   Glucose, Bld 107 (*)    All other components within normal limits  CBC WITH DIFFERENTIAL/PLATELET    EKG None  Radiology CT Head Wo Contrast  Result Date: 12/02/2020 CLINICAL DATA:  Headache. Patient will cup with left eye swollen. Hypertension. EXAM: CT HEAD WITHOUT CONTRAST CT ORBITS WITH CONTRAST TECHNIQUE: Multidetector CT imaging of the head was performed following the standard protocol without contrast. Multidetector CT imaging of the orbits were performed following the standard protocol with and without intravenous contrast. CONTRAST:  75mL OMNIPAQUE IOHEXOL 300 MG/ML  SOLN COMPARISON:  None. FINDINGS: CT HEAD FINDINGS Brain: No evidence of acute infarction, hemorrhage, hydrocephalus, extra-axial collection or mass lesion/mass effect. Partially empty and expanded sella. Vascular: No hyperdense vessel identified. Skull: No acute fracture. Other: No mastoid effusions. CT ORBITS FINDINGS Orbits: There is left preseptal soft tissue stranding, compact with nonspecific edema. No discrete drainable fluid collection. No evidence of postseptal inflammation. The globes are symmetric and within normal limits. No evidence of proptosis. Extraocular muscles are unremarkable. Visualized sinuses: Inferior left maxillary sinus retention cysts. Otherwise, sinuses are clear. IMPRESSION: CT head: 1. No evidence of acute intracranial abnormality. 2. Partially empty and expanded sella. While this finding is frequently incidental, it can be seen with idiopathic intracranial hypertension in the correct clinical setting. CT orbits: Nonspecific left preseptal periorbital edema. Correlate for signs/symptoms of cellulitis. No evidence of postseptal involvement or discrete fluid collection. Electronically Signed   By: Margaretha Sheffield MD   On: 12/02/2020 11:44   CT  Orbits W Contrast  Result Date: 12/02/2020 CLINICAL DATA:  Headache. Patient will cup with left eye swollen. Hypertension. EXAM: CT HEAD WITHOUT CONTRAST CT ORBITS WITH CONTRAST TECHNIQUE: Multidetector CT imaging of the head was performed following the standard protocol without contrast. Multidetector CT imaging of the orbits were performed following the standard protocol with and without intravenous contrast. CONTRAST:  19mL OMNIPAQUE IOHEXOL 300 MG/ML  SOLN COMPARISON:  None. FINDINGS: CT HEAD FINDINGS Brain: No evidence of acute infarction, hemorrhage, hydrocephalus, extra-axial collection or mass lesion/mass effect. Partially empty and expanded sella. Vascular: No hyperdense vessel identified. Skull: No acute fracture. Other: No mastoid effusions. CT ORBITS FINDINGS Orbits: There is left preseptal soft tissue stranding, compact with nonspecific edema. No discrete drainable fluid collection. No evidence of postseptal inflammation. The globes are symmetric and within normal limits. No evidence of proptosis. Extraocular muscles are unremarkable. Visualized sinuses: Inferior left maxillary sinus retention cysts. Otherwise, sinuses are clear. IMPRESSION: CT head: 1. No evidence of acute intracranial abnormality. 2. Partially empty and expanded sella. While this finding is frequently incidental, it can be seen with idiopathic intracranial hypertension in the correct clinical setting. CT orbits: Nonspecific left preseptal periorbital edema. Correlate for signs/symptoms of cellulitis. No evidence of postseptal involvement or discrete fluid collection. Electronically Signed   By: Margaretha Sheffield MD   On: 12/02/2020 11:44    Procedures Procedures (including critical care time)  Medications Ordered in ED Medications  iohexol (OMNIPAQUE) 300 MG/ML solution 80 mL (80 mLs Intravenous Contrast Given 12/02/20 1116)    ED Course  I have reviewed the triage vital signs and the nursing notes.  Pertinent labs &  imaging results that were available during my care of the patient were reviewed by me and considered in my medical decision making (see chart for details).  Patient with nonfocal neuro exam, recent onset" headache.  No neck stiffness or neck rigidity.  No concerns for meningitis.  No current headache.  No vision changes.  Does have soft tissue swelling to surrounding left eye consistent with preseptal cellulitis.  EOMs intact without pain.  Labs without any significant findings which I personally viewed and interpreted.  CT head with possible part generally empty sella however symptoms today are not consistent with intracranial hypertension.  CT max face does show nonspecific soft tissue swelling surrounding left eye.  No abscess, orbital cellulitis.  Presentation non concerning for [facility], ICH, Meningitis, or temporal arteritis. Pt is afebrile with no focal neuro deficits, nuchal rigidity, or change in vision.   Patient reassessed.  No complaints.  Tolerating p.o. intake without difficulty.  Afebrile.  No leukocytosis.  Will treat with antibiotics.  Discussed close follow with PCP or return here to the ED for any worsening symptoms.  The patient has been appropriately medically screened and/or stabilized in the ED. I have low suspicion for any other emergent medical condition which would require further screening, evaluation or treatment in the ED or require inpatient management.  Patient is hemodynamically stable and in no acute distress.  Patient able to ambulate in department prior to ED.  Evaluation does not show acute pathology that would require ongoing or additional emergent interventions while in the emergency department or further inpatient treatment.  I have discussed the diagnosis with the patient and answered all questions.  Pain is been managed while in the emergency department and patient has no further complaints prior to discharge.  Patient is comfortable with plan discussed in room and is  stable for discharge at this time.  I have discussed strict return precautions for returning to the emergency department.  Patient was encouraged to follow-up with PCP/specialist refer to at discharge.    MDM Rules/Calculators/A&P                           Final Clinical Impression(s) / ED Diagnoses Final diagnoses:  Preseptal cellulitis  of left eye    Rx / DC Orders ED Discharge Orders         Ordered    fluticasone (FLONASE) 50 MCG/ACT nasal spray  Daily        12/02/20 1232    sulfamethoxazole-trimethoprim (BACTRIM DS) 800-160 MG tablet  2 times daily        12/02/20 1232           Bentli Llorente A, PA-C 12/02/20 [place], Ankit, MD 12/03/20 2030

## 2020-12-02 NOTE — Discharge Instructions (Signed)
Take the medications as prescribed  Return for new or worsening symptoms 

## 2020-12-02 NOTE — ED Triage Notes (Signed)
Headache since yesterday. Woke up with her L eye swollen. States she took her BP and it was high. She takes meds for HTN

## 2020-12-30 ENCOUNTER — Other Ambulatory Visit: Payer: Self-pay

## 2020-12-30 ENCOUNTER — Emergency Department (HOSPITAL_BASED_OUTPATIENT_CLINIC_OR_DEPARTMENT_OTHER)
Admission: EM | Admit: 2020-12-30 | Discharge: 2020-12-30 | Disposition: A | Discharge status: Home / Self Care | Payer: Medicaid Other | Attending: Emergency Medicine | Admitting: Emergency Medicine

## 2020-12-30 ENCOUNTER — Encounter (HOSPITAL_BASED_OUTPATIENT_CLINIC_OR_DEPARTMENT_OTHER): Payer: Self-pay | Admitting: Emergency Medicine

## 2020-12-30 DIAGNOSIS — Z5321 Procedure and treatment not carried out due to patient leaving prior to being seen by health care provider: Secondary | ICD-10-CM | POA: Insufficient documentation

## 2020-12-30 DIAGNOSIS — R109 Unspecified abdominal pain: Secondary | ICD-10-CM | POA: Insufficient documentation

## 2020-12-30 LAB — URINALYSIS, ROUTINE W REFLEX MICROSCOPIC
Bilirubin Urine: NEGATIVE
Glucose, UA: NEGATIVE mg/dL
Ketones, ur: NEGATIVE mg/dL
Leukocytes,Ua: NEGATIVE
Nitrite: NEGATIVE
Protein, ur: NEGATIVE mg/dL
Specific Gravity, Urine: 1.025 (ref 1.005–1.030)
pH: 5 (ref 5.0–8.0)

## 2020-12-30 LAB — URINALYSIS, MICROSCOPIC (REFLEX)

## 2020-12-30 NOTE — ED Triage Notes (Signed)
Right sided flank pain since yesterday. Denies N/V

## 2021-06-23 ENCOUNTER — Emergency Department (HOSPITAL_BASED_OUTPATIENT_CLINIC_OR_DEPARTMENT_OTHER)
Admission: EM | Admit: 2021-06-23 | Discharge: 2021-06-23 | Disposition: A | Discharge status: Home / Self Care | Payer: Self-pay | Attending: Emergency Medicine | Admitting: Emergency Medicine

## 2021-06-23 ENCOUNTER — Emergency Department (HOSPITAL_BASED_OUTPATIENT_CLINIC_OR_DEPARTMENT_OTHER): Payer: Self-pay

## 2021-06-23 ENCOUNTER — Other Ambulatory Visit: Payer: Self-pay

## 2021-06-23 DIAGNOSIS — U071 COVID-19: Secondary | ICD-10-CM | POA: Insufficient documentation

## 2021-06-23 DIAGNOSIS — I10 Essential (primary) hypertension: Secondary | ICD-10-CM | POA: Insufficient documentation

## 2021-06-23 DIAGNOSIS — F17210 Nicotine dependence, cigarettes, uncomplicated: Secondary | ICD-10-CM | POA: Insufficient documentation

## 2021-06-23 DIAGNOSIS — Z28310 Unvaccinated for covid-19: Secondary | ICD-10-CM | POA: Insufficient documentation

## 2021-06-23 LAB — COMPREHENSIVE METABOLIC PANEL
ALT: 17 U/L (ref 0–44)
AST: 25 U/L (ref 15–41)
Albumin: 3.8 g/dL (ref 3.5–5.0)
Alkaline Phosphatase: 59 U/L (ref 38–126)
Anion gap: 8 (ref 5–15)
BUN: 14 mg/dL (ref 6–20)
CO2: 27 mmol/L (ref 22–32)
Calcium: 8.9 mg/dL (ref 8.9–10.3)
Chloride: 102 mmol/L (ref 98–111)
Creatinine, Ser: 0.85 mg/dL (ref 0.44–1.00)
GFR, Estimated: >60 mL/min (ref >60)
Glucose, Bld: 213 mg/dL — ABNORMAL HIGH (ref 70–99)
Potassium: 3.7 mmol/L (ref 3.5–5.1)
Sodium: 137 mmol/L (ref 135–145)
Total Bilirubin: 0.3 mg/dL (ref 0.3–1.2)
Total Protein: 7.3 g/dL (ref 6.5–8.1)

## 2021-06-23 LAB — CBC WITH DIFFERENTIAL/PLATELET
Abs Immature Granulocytes: 0.01 10*3/uL (ref 0.00–0.07)
Basophils Absolute: 0 10*3/uL (ref 0.0–0.1)
Basophils Relative: 0 %
Eosinophils Absolute: 0 10*3/uL (ref 0.0–0.5)
Eosinophils Relative: 0 %
HCT: 43 % (ref 36.0–46.0)
Hemoglobin: 14.2 g/dL (ref 12.0–15.0)
Immature Granulocytes: 0 %
Lymphocytes Relative: 22 %
Lymphs Abs: 1.2 10*3/uL (ref 0.7–4.0)
MCH: 29.4 pg (ref 26.0–34.0)
MCHC: 33 g/dL (ref 30.0–36.0)
MCV: 89 fL (ref 80.0–100.0)
Monocytes Absolute: 0.5 10*3/uL (ref 0.1–1.0)
Monocytes Relative: 10 %
Neutro Abs: 3.7 10*3/uL (ref 1.7–7.7)
Neutrophils Relative %: 68 %
Platelets: 303 10*3/uL (ref 150–400)
RBC: 4.83 MIL/uL (ref 3.87–5.11)
RDW: 13.2 % (ref 11.5–15.5)
WBC: 5.5 10*3/uL (ref 4.0–10.5)
nRBC: 0 % (ref 0.0–0.2)

## 2021-06-23 LAB — D-DIMER, QUANTITATIVE: D-Dimer, Quant: 0.33 ug/mL-FEU (ref 0.00–0.50)

## 2021-06-23 LAB — TROPONIN I (HIGH SENSITIVITY): Troponin I (High Sensitivity): 3 ng/L (ref <18)

## 2021-06-23 MED ORDER — DIPHENHYDRAMINE HCL 50 MG/ML IJ SOLN
12.5000 mg | Freq: Once | INTRAMUSCULAR | Status: AC
  Administered 2021-06-23: 12.5 mg via INTRAVENOUS
  Filled 2021-06-23: qty 1

## 2021-06-23 MED ORDER — KETOROLAC TROMETHAMINE 30 MG/ML IJ SOLN
30.0000 mg | Freq: Once | INTRAMUSCULAR | Status: AC
  Administered 2021-06-23: 30 mg via INTRAVENOUS
  Filled 2021-06-23: qty 1

## 2021-06-23 MED ORDER — PROCHLORPERAZINE EDISYLATE 10 MG/2ML IJ SOLN
5.0000 mg | Freq: Once | INTRAMUSCULAR | Status: AC
  Administered 2021-06-23: 5 mg via INTRAVENOUS
  Filled 2021-06-23: qty 2

## 2021-06-23 MED ORDER — SODIUM CHLORIDE 0.9 % IV BOLUS
1000.0000 mL | Freq: Once | INTRAVENOUS | Status: AC
Start: 2021-06-23 — End: 2021-06-23
  Administered 2021-06-23: 1000 mL via INTRAVENOUS

## 2021-06-23 NOTE — ED Notes (Signed)
Pt verbally acknowledges understanding of d/c instruction. Unable to obtain signature due to sig pad not working

## 2021-06-23 NOTE — ED Triage Notes (Signed)
Pt tested positive for COVID on 8/2. States blood in sputum when coughing. C/o sore throat and right sided chest pain x 3 days. Tender to chest with palpation.

## 2021-06-23 NOTE — Discharge Instructions (Addendum)
  You were evaluated in the Emergency Department and after careful evaluation, we did not find any emergent condition requiring admission or further testing in the hospital.   Your exam/testing today was overall reassuring.  Symptoms seem to be due to  COVID.  Please continue taking your medications.  You can go back to work on Monday, make sure to follow CDC guidelines and continue to wear your mask for a full 10 days.  If you have any new worsening concerning symptoms such as shortness of breath or difficulty breathing please connect to the ER.  Please use the attached instructions. Please return to the Emergency Department if you experience any worsening of your condition.  Thank you for allowing [place] to be a part of your care. Please speak to your pharmacist about any new medications prescribed today in regards to side effects or interactions with other medications.

## 2021-06-23 NOTE — ED Provider Notes (Signed)
[facility] EMERGENCY DEPARTMENT Provider Note   CSN: [id number] Arrival date & time: 06/23/21  1334     History Chief Complaint  Patient presents with   Cough    Covid +    Stacey Weeks is a 45 y.o. female with pertinent past medical history of anemia, tobacco abuse who presents emerged department today for hemoptysis and COVID symptoms.  patient states that she has been coughing since she has been diagnosed with COVID 4 days ago.  States that she has been seeing streaks of blood in her mucus, states that sometimes it will be teaspoons.  Also admits to sore throat and right-sided chest pain for the past 3 days.  Patient states that chest is tender to palpation.  States that its not pleuritic, is constantly having chest pain.  Denies any shortness of breath.  Denies any fevers.  Does admit to nausea, states that she is unable to keep a lot of food down, however is taking over-the-counter nausea medication which has been helping.  Patient was not vaccinated against COVID, unsure if she had any sick contacts.  Denies any diarrhea, does admit to myalgias.  States that she is generally healthy, has never had blood clot.  States that she was given steroids and nausea medication when she was seen for COVID on Tuesday.  HPI     Past Medical History:  Diagnosis Date   Active smoker    Anemia    Blood transfusion 2000   Blood transfusion without reported diagnosis    Chronic pelvic pain in female    Endometriosis    Fibroid, uterus    Headache(784.0)    Hypertension    Palpitations 02/13/2018    Patient Active Problem List   Diagnosis Date Noted   Hypokalemia 02/15/2018   Palpitations 02/13/2018   Allergy to pollen 09/17/2017   Anosmia 09/17/2017   Cigarette smoker 09/17/2017   Laceration of left index finger with foreign body and damage to nail 08/07/2016   Open nondisplaced fracture of distal phalanx of left index finger with routine healing 08/07/2016   Nicotine use  disorder 09/13/2015   Chronic low back pain 06/06/2015   Degeneration, intervertebral disc, cervical 06/06/2015   Neck pain 06/06/2015   Pain in joint 06/06/2015   History of hysterectomy 03/28/2015    Past Surgical History:  Procedure Laterality Date   ABDOMINAL HYSTERECTOMY  12/11/2011   Procedure: HYSTERECTOMY ABDOMINAL;  Surgeon: Margarette Asal, MD;  Location: [facility];  Service: Gynecology;  Laterality: N/A;   CYSTOSCOPY     GANGLION CYST EXCISION  age 57   right wrist   HYSTEROSCOPY WITH D & C  10-26-10   polypectomy   INCISION AND DRAINAGE OF WOUND Left 08/01/2016   Procedure: IRRIGATION AND DEBRIDEMENT WOUND;  Surgeon: Leanora Cover, MD;  Location: [facility];  Service: Orthopedics;  Laterality: Left;   LAPAROSCOPY  10/01/2011   Procedure: LAPAROSCOPY DIAGNOSTIC;  Surgeon: Margarette Asal;  Location: [facility];  Service: Gynecology;  Laterality: N/A;   SALPINGOOPHORECTOMY  12/11/2011   Procedure: SALPINGO OOPHERECTOMY;  Surgeon: Margarette Asal, MD;  Location: [facility];  Service: Gynecology;  Laterality: Bilateral;     OB History     Gravida  0   Para      Term      Preterm      AB      Living         SAB  IAB      Ectopic      Multiple      Live Births              Family History  Problem Relation Age of Onset   Diabetes Maternal Grandmother    Hypertension Maternal Grandmother    Heart disease Maternal Grandmother     Social History   Tobacco Use   Smoking status: Every Day    Packs/day: 0.50    Years: 6.00    Pack years: 3.00    Types: Cigarettes   Smokeless tobacco: Never  Vaping Use   Vaping Use: Never used  Substance Use Topics   Alcohol use: No   Drug use: No    Home Medications Prior to Admission medications   Medication Sig Start Date End Date Taking? Authorizing Provider  fluticasone (FLONASE) 50 MCG/ACT nasal spray Place 2 sprays into both nostrils daily. 12/02/20   Henderly, Britni  A, PA-C  meclizine (ANTIVERT) 25 MG tablet Take 1 tablet (25 mg total) by mouth 3 (three) times daily as needed for dizziness. 09/15/18   Kinnie Feil, PA-C  potassium chloride (K-DUR) 10 MEQ tablet Take 4 tablets (40 mEq total) by mouth daily for 7 days. 09/15/18 09/22/18  Kinnie Feil, PA-C  potassium chloride SA (K-DUR,KLOR-CON) 20 MEQ tablet Take 1 tablet (20 mEq total) by mouth daily. 02/05/18   Molpus, John, MD    Allergies    Penicillin g and Percocet [oxycodone-acetaminophen]  Review of Systems   Review of Systems  Constitutional:  Negative for chills, diaphoresis, fatigue and fever.  HENT:  Positive for sore throat. Negative for congestion, dental problem, ear discharge, ear pain, hearing loss, nosebleeds, postnasal drip, sinus pain and trouble swallowing.   Eyes:  Negative for pain and visual disturbance.  Respiratory:  Positive for cough. Negative for shortness of breath and wheezing.   Cardiovascular:  Positive for chest pain. Negative for palpitations and leg swelling.  Gastrointestinal:  Negative for abdominal distention, abdominal pain, diarrhea, nausea and vomiting.  Genitourinary:  Negative for difficulty urinating.  Musculoskeletal:  Positive for arthralgias and myalgias. Negative for back pain, neck pain and neck stiffness.  Skin:  Negative for pallor.  Neurological:  Negative for dizziness, speech difficulty, weakness and headaches.  Psychiatric/Behavioral:  Negative for confusion.    Physical Exam Updated Vital Signs BP 121/89   Pulse 72   Temp 98.6 F (37 C) (Oral)   Resp (!) 8   Ht 5' 7.5" (1.715 m)   Wt 96 kg   LMP 09/15/2011   SpO2 100%   BMI 32.66 kg/m   Physical Exam Constitutional:      General: She is not in acute distress.    Appearance: Normal appearance. She is not ill-appearing, toxic-appearing or diaphoretic.  HENT:     Mouth/Throat:     Mouth: Mucous membranes are moist.     Pharynx: Oropharynx is clear.  Eyes:     General: No  scleral icterus.    Extraocular Movements: Extraocular movements intact.     Pupils: Pupils are equal, round, and reactive to light.  Cardiovascular:     Rate and Rhythm: Normal rate and regular rhythm.     Pulses: Normal pulses.     Heart sounds: Normal heart sounds.  Pulmonary:     Effort: Pulmonary effort is normal. No respiratory distress.     Breath sounds: Normal breath sounds. No stridor. No wheezing, rhonchi or rales.  Chest:  Chest wall: No tenderness.       Comments: TTP to right side as depicted in picture. No rashes or erythema. Abdominal:     General: Abdomen is flat. There is no distension.     Palpations: Abdomen is soft.     Tenderness: There is no abdominal tenderness. There is no guarding or rebound.  Musculoskeletal:        General: No swelling or tenderness. Normal range of motion.     Cervical back: Normal range of motion and neck supple. No rigidity.     Right lower leg: No edema.     Left lower leg: No edema.  Skin:    General: Skin is warm and dry.     Capillary Refill: Capillary refill takes less than 2 seconds.     Coloration: Skin is not pale.  Neurological:     General: No focal deficit present.     Mental Status: She is alert and oriented to person, place, and time.  Psychiatric:        Mood and Affect: Mood normal.        Behavior: Behavior normal.    ED Results / Procedures / Treatments   Labs (all labs ordered are listed, but only abnormal results are displayed) Labs Reviewed  COMPREHENSIVE METABOLIC PANEL - Abnormal; Notable for the following components:      Result Value   Glucose, Bld 213 (*)    All other components within normal limits  CBC WITH DIFFERENTIAL/PLATELET  D-DIMER, QUANTITATIVE  TROPONIN I (HIGH SENSITIVITY)    EKG EKG Interpretation  Date/Time:  Saturday June 23 2021 14:45:23 EDT Ventricular Rate:  87 PR Interval:  141 QRS Duration: 91 QT Interval:  359 QTC Calculation: 432 R Axis:   57 Text  Interpretation: Sinus rhythm Probable left atrial enlargement No significant change since last tracing Confirmed by Calvert Cantor [phone]) on 06/23/2021 2:48:56 PM  Radiology DG Chest Portable 1 View  Result Date: 06/23/2021 CLINICAL DATA:  Cough and chest pain.  Positive COVID. EXAM: PORTABLE CHEST 1 VIEW COMPARISON:  12/20/2018 FINDINGS: The cardiomediastinal silhouette is unremarkable. There is no evidence of focal airspace disease, pulmonary edema, suspicious pulmonary nodule/mass, pleural effusion, or pneumothorax. No acute bony abnormalities are identified. IMPRESSION: No active disease. Electronically Signed   By: Margarette Canada M.D.   On: 06/23/2021 15:18    Procedures Procedures   Medications Ordered in ED Medications  prochlorperazine (COMPAZINE) injection 5 mg (5 mg Intravenous Given 06/23/21 1547)  diphenhydrAMINE (BENADRYL) injection 12.5 mg (12.5 mg Intravenous Given 06/23/21 1545)  sodium chloride 0.9 % bolus 1,000 mL (0 mLs Intravenous Stopped 06/23/21 1649)  ketorolac (TORADOL) 30 MG/ML injection 30 mg (30 mg Intravenous Given 06/23/21 1546)    ED Course  I have reviewed the triage vital signs and the nursing notes.  Pertinent labs & imaging results that were available during my care of the patient were reviewed by me and considered in my medical decision making (see chart for details).    MDM Rules/Calculators/A&P                          Stacey Weeks is a 45 y.o. female with pertinent past medical history of anemia, tobacco abuse who presents emerged department today for hemoptysis and COVID symptoms.  Patient appears well, however with hemoptysis and patient being PERC positive with heart rate of 102 will obtain D-dimer to rule out PE.  EKG inter by me  without any signs of ischemia or arrhythmia.  Chest x-ray interpreted by me without any acute cardiopulmonary disease.  CBC and CMP unremarkable.  Patient does have elevated glucose, most likely due to steroid use.  Troponin and  D-dimer negative.  Do not need second troponin at this time, chest pain has been chronic and most likely due to myalgias from COVID-19.  Upon reevaluation patient states that she feels much better, to be discharged at this time.  Strict return precautions given.  Doubt need for further emergent work up at this time. I explained the diagnosis and have given explicit precautions to return to the ER including for any other new or worsening symptoms. The patient understands and accepts the medical plan as it's been dictated and I have answered their questions. Discharge instructions concerning home care and prescriptions have been given. The patient is STABLE and is discharged to home in good condition.  Final Clinical Impression(s) / ED Diagnoses Final diagnoses:  COVID    Rx / DC Orders ED Discharge Orders     None        Alfredia Client, PA-C 06/23/21 1738    Davonna Belling, MD 06/24/21 [phone]

## 2021-06-23 NOTE — ED Notes (Signed)
Sprite provided for po challenge 

## 2021-06-28 ENCOUNTER — Emergency Department (HOSPITAL_BASED_OUTPATIENT_CLINIC_OR_DEPARTMENT_OTHER): Payer: Self-pay

## 2021-06-28 ENCOUNTER — Encounter (HOSPITAL_BASED_OUTPATIENT_CLINIC_OR_DEPARTMENT_OTHER): Payer: Self-pay | Admitting: Emergency Medicine

## 2021-06-28 ENCOUNTER — Emergency Department (HOSPITAL_BASED_OUTPATIENT_CLINIC_OR_DEPARTMENT_OTHER)
Admission: EM | Admit: 2021-06-28 | Discharge: 2021-06-28 | Disposition: A | Discharge status: Home / Self Care | Payer: Self-pay | Attending: Emergency Medicine | Admitting: Emergency Medicine

## 2021-06-28 ENCOUNTER — Other Ambulatory Visit: Payer: Self-pay

## 2021-06-28 DIAGNOSIS — I10 Essential (primary) hypertension: Secondary | ICD-10-CM | POA: Insufficient documentation

## 2021-06-28 DIAGNOSIS — R0981 Nasal congestion: Secondary | ICD-10-CM | POA: Insufficient documentation

## 2021-06-28 DIAGNOSIS — R1011 Right upper quadrant pain: Secondary | ICD-10-CM | POA: Insufficient documentation

## 2021-06-28 DIAGNOSIS — R112 Nausea with vomiting, unspecified: Secondary | ICD-10-CM | POA: Insufficient documentation

## 2021-06-28 DIAGNOSIS — U071 COVID-19: Secondary | ICD-10-CM

## 2021-06-28 DIAGNOSIS — J320 Chronic maxillary sinusitis: Secondary | ICD-10-CM

## 2021-06-28 DIAGNOSIS — M791 Myalgia, unspecified site: Secondary | ICD-10-CM | POA: Insufficient documentation

## 2021-06-28 DIAGNOSIS — F17210 Nicotine dependence, cigarettes, uncomplicated: Secondary | ICD-10-CM | POA: Insufficient documentation

## 2021-06-28 DIAGNOSIS — R509 Fever, unspecified: Secondary | ICD-10-CM | POA: Insufficient documentation

## 2021-06-28 DIAGNOSIS — Z28310 Unvaccinated for covid-19: Secondary | ICD-10-CM | POA: Insufficient documentation

## 2021-06-28 LAB — CBC
HCT: 44.1 % (ref 36.0–46.0)
Hemoglobin: 14.6 g/dL (ref 12.0–15.0)
MCH: 29.6 pg (ref 26.0–34.0)
MCHC: 33.1 g/dL (ref 30.0–36.0)
MCV: 89.5 fL (ref 80.0–100.0)
Platelets: 332 10*3/uL (ref 150–400)
RBC: 4.93 MIL/uL (ref 3.87–5.11)
RDW: 13.2 % (ref 11.5–15.5)
WBC: 6.2 10*3/uL (ref 4.0–10.5)
nRBC: 0 % (ref 0.0–0.2)

## 2021-06-28 LAB — BASIC METABOLIC PANEL
Anion gap: 10 (ref 5–15)
BUN: 12 mg/dL (ref 6–20)
CO2: 27 mmol/L (ref 22–32)
Calcium: 9.9 mg/dL (ref 8.9–10.3)
Chloride: 104 mmol/L (ref 98–111)
Creatinine, Ser: 0.78 mg/dL (ref 0.44–1.00)
GFR, Estimated: >60 mL/min (ref >60)
Glucose, Bld: 101 mg/dL — ABNORMAL HIGH (ref 70–99)
Potassium: 4.3 mmol/L (ref 3.5–5.1)
Sodium: 141 mmol/L (ref 135–145)

## 2021-06-28 LAB — HEPATIC FUNCTION PANEL
ALT: 16 U/L (ref 0–44)
AST: 21 U/L (ref 15–41)
Albumin: 4.4 g/dL (ref 3.5–5.0)
Alkaline Phosphatase: 66 U/L (ref 38–126)
Bilirubin, Direct: 0.1 mg/dL (ref 0.0–0.2)
Indirect Bilirubin: 0.4 mg/dL (ref 0.3–0.9)
Total Bilirubin: 0.5 mg/dL (ref 0.3–1.2)
Total Protein: 7.2 g/dL (ref 6.5–8.1)

## 2021-06-28 LAB — LIPASE, BLOOD: Lipase: 29 U/L (ref 11–51)

## 2021-06-28 MED ORDER — DOXYCYCLINE HYCLATE 100 MG PO CAPS: 100.0000 mg | ORAL_CAPSULE | Freq: Two times a day (BID) | ORAL | 0 refills | Status: AC

## 2021-06-28 MED ORDER — FAMOTIDINE 20 MG PO TABS: 20.0000 mg | ORAL_TABLET | Freq: Two times a day (BID) | ORAL | 0 refills | Status: AC

## 2021-06-28 MED ORDER — FAMOTIDINE IN NACL 20-0.9 MG/50ML-% IV SOLN
20.0000 mg | Freq: Once | INTRAVENOUS | Status: AC
  Administered 2021-06-28: 20 mg via INTRAVENOUS
  Filled 2021-06-28: qty 50

## 2021-06-28 MED ORDER — FLUTICASONE PROPIONATE 50 MCG/ACT NA SUSP: 2.0000 | Freq: Every day | NASAL | 2 refills | Status: AC

## 2021-06-28 NOTE — ED Provider Notes (Signed)
[facility] EMERGENCY DEPARTMENT Provider Note   CSN: [id number] Arrival date & time: 06/28/21  0732     History Chief Complaint  Patient presents with   Nausea   Vomiting    Stacey Weeks is a 45 y.o. female with a history of hypertension presenting to emergency department with congestion, myalgias, nausea and vomiting.  The patient reports that she tested positive for COVID on August 2, with onset of symptoms the same day, experiencing fevers, chills, headaches, weakness at the time.  She tested positive at [facility].  She has not had the COVID vaccines.  She reports that some of her symptoms have improved, but she continues to have congestion in her sinuses, foul tasting discharge from the sinus, change in smell and taste.  She also reports nausea and vomiting for the past 2 days, able to keep down water but difficulty with any food.  She describes sharp and burning pain in her epigastrium and right upper quadrant which is intermittent and comes and goes.  She reports some loose bowel movements.  She does suffer from acid reflux.  She is not currently taking any medications for this.  She reports an abdominal surgical history of a hysterectomy only.  HPI     Past Medical History:  Diagnosis Date   Active smoker    Anemia    Blood transfusion 2000   Blood transfusion without reported diagnosis    Chronic pelvic pain in female    Endometriosis    Fibroid, uterus    Headache(784.0)    Hypertension    Palpitations 02/13/2018    Patient Active Problem List   Diagnosis Date Noted   Hypokalemia 02/15/2018   Palpitations 02/13/2018   Allergy to pollen 09/17/2017   Anosmia 09/17/2017   Cigarette smoker 09/17/2017   Laceration of left index finger with foreign body and damage to nail 08/07/2016   Open nondisplaced fracture of distal phalanx of left index finger with routine healing 08/07/2016   Nicotine use disorder 09/13/2015   Chronic low back  pain 06/06/2015   Degeneration, intervertebral disc, cervical 06/06/2015   Neck pain 06/06/2015   Pain in joint 06/06/2015   History of hysterectomy 03/28/2015    Past Surgical History:  Procedure Laterality Date   ABDOMINAL HYSTERECTOMY  12/11/2011   Procedure: HYSTERECTOMY ABDOMINAL;  Surgeon: Margarette Asal, MD;  Location: [facility];  Service: Gynecology;  Laterality: N/A;   CYSTOSCOPY     GANGLION CYST EXCISION  age 50   right wrist   HYSTEROSCOPY WITH D & C  10-26-10   polypectomy   INCISION AND DRAINAGE OF WOUND Left 08/01/2016   Procedure: IRRIGATION AND DEBRIDEMENT WOUND;  Surgeon: Leanora Cover, MD;  Location: [facility];  Service: Orthopedics;  Laterality: Left;   LAPAROSCOPY  10/01/2011   Procedure: LAPAROSCOPY DIAGNOSTIC;  Surgeon: Margarette Asal;  Location: [facility];  Service: Gynecology;  Laterality: N/A;   SALPINGOOPHORECTOMY  12/11/2011   Procedure: SALPINGO OOPHERECTOMY;  Surgeon: Margarette Asal, MD;  Location: [facility];  Service: Gynecology;  Laterality: Bilateral;     OB History     Gravida  0   Para      Term      Preterm      AB      Living         SAB      IAB      Ectopic      Multiple  Live Births              Family History  Problem Relation Age of Onset   Diabetes Maternal Grandmother    Hypertension Maternal Grandmother    Heart disease Maternal Grandmother     Social History   Tobacco Use   Smoking status: Every Day    Packs/day: 0.50    Years: 6.00    Pack years: 3.00    Types: Cigarettes   Smokeless tobacco: Never  Vaping Use   Vaping Use: Never used  Substance Use Topics   Alcohol use: No   Drug use: No    Home Medications Prior to Admission medications   Medication Sig Start Date End Date Taking? Authorizing Provider  fluticasone (FLONASE) 50 MCG/ACT nasal spray Place 2 sprays into both nostrils daily. 12/02/20   Henderly, Britni A, PA-C  meclizine (ANTIVERT) 25 MG  tablet Take 1 tablet (25 mg total) by mouth 3 (three) times daily as needed for dizziness. 09/15/18   Kinnie Feil, PA-C  potassium chloride (K-DUR) 10 MEQ tablet Take 4 tablets (40 mEq total) by mouth daily for 7 days. 09/15/18 09/22/18  Kinnie Feil, PA-C  potassium chloride SA (K-DUR,KLOR-CON) 20 MEQ tablet Take 1 tablet (20 mEq total) by mouth daily. 02/05/18   Molpus, Jenny Reichmann, MD    Allergies    Penicillin g and Percocet [oxycodone-acetaminophen]  Review of Systems   Review of Systems  Constitutional:  Positive for fatigue. Negative for chills and fever.  HENT:  Positive for congestion, ear pain, sinus pressure and sinus pain.   Eyes:  Negative for pain and visual disturbance.  Respiratory:  Negative for cough and shortness of breath.   Cardiovascular:  Negative for chest pain and palpitations.  Gastrointestinal:  Positive for abdominal pain, nausea and vomiting.  Genitourinary:  Negative for dysuria and hematuria.  Musculoskeletal:  Negative for arthralgias and back pain.  Skin:  Negative for color change and rash.  Neurological:  Positive for headaches. Negative for syncope.  All other systems reviewed and are negative.  Physical Exam Updated Vital Signs BP (!) 131/98 (BP Location: Right Arm)   Pulse 89   Temp 98.3 F (36.8 C) (Oral)   Resp 18   Ht '5\' 7"'$  (1.702 m)   Wt 96.2 kg   LMP 09/15/2011   SpO2 99%   BMI 33.20 kg/m   Physical Exam Constitutional:      General: She is not in acute distress. HENT:     Head: Normocephalic and atraumatic.     Comments: Bilateral maxillary sinus ttp    Right Ear: Tympanic membrane and ear canal normal.     Left Ear: Tympanic membrane and ear canal normal.     Nose: Congestion present.  Eyes:     Conjunctiva/sclera: Conjunctivae normal.     Pupils: Pupils are equal, round, and reactive to light.  Cardiovascular:     Rate and Rhythm: Normal rate and regular rhythm.  Pulmonary:     Effort: Pulmonary effort is normal. No  respiratory distress.  Abdominal:     General: There is no distension.     Tenderness: There is abdominal tenderness in the right upper quadrant and epigastric area.  Skin:    General: Skin is warm and dry.  Neurological:     General: No focal deficit present.     Mental Status: She is alert. Mental status is at baseline.  Psychiatric:        Mood and Affect:  Mood normal.        Behavior: Behavior normal.    ED Results / Procedures / Treatments   Labs (all labs ordered are listed, but only abnormal results are displayed) Labs Reviewed  CBC  BASIC METABOLIC PANEL  HEPATIC FUNCTION PANEL  LIPASE, BLOOD    EKG None  Radiology No results found.  Procedures Procedures   Medications Ordered in ED Medications - No data to display  ED Course  I have reviewed the triage vital signs and the nursing notes.  Pertinent labs & imaging results that were available during my care of the patient were reviewed by me and considered in my medical decision making (see chart for details).  This patient complains of fever, nausea, vomiting.  This involves an extensive number of treatment options, and is a complaint that carries with it a high risk of complications and morbidity.  The differential diagnosis includes viral syndrome (Covid day 9) vs gastritis vs biliary disease vs other  I ordered, reviewed, and interpreted labs.  No life-threatening abnormalities were noted on these tests. I ordered medication pepcid for gastritis I ordered imaging studies which included RUQ ultrasound I independently visualized and interpreted imaging which showed no life-threatening abnormalities, and the monitor tracing which showed NSR  After the interventions stated above, I reevaluated the patient and found she remained stable, vitals normal.  No respiratory distress or hypoxia.   I explained this is likely a sequela of her covid syndrome, I would advise continued conservative management for the next  week.  Pepcid prescribed for suspected reflux.  I doubt biliary disease, pancreatitis, sepsis, GI perforation, or other acute intraabdominal or intrathoracic emergency at this time.  Lester Kinsman was evaluated in Emergency Department on 06/28/2021 for the symptoms described in the history of present illness. She was evaluated in the context of the global COVID-19 pandemic, which necessitated consideration that the patient might be at risk for infection with the SARS-CoV-2 virus that causes COVID-19. Institutional protocols and algorithms that pertain to the evaluation of patients at risk for COVID-19 are in a state of rapid change based on information released by regulatory bodies including the CDC and federal and state organizations. These policies and algorithms were followed during the patient's care in the ED.   Clinical Course as of 06/28/21 1638  Thu Jun 28, 2021  0957 Unremarkable RUQ ultrasound and LFT.  Okay for discharge [MT]    Clinical Course User Index [MT] Boniface Goffe, Carola Rhine, MD    Final Clinical Impression(s) / ED Diagnoses Final diagnoses:  Nausea & vomiting    Rx / DC Orders ED Discharge Orders     None        Langston Masker Carola Rhine, MD 06/28/21 1640

## 2021-06-28 NOTE — ED Triage Notes (Signed)
Patient complaining of vomiting x 2 days. Feels there is something else wrong besides covid.

## 2021-08-02 ENCOUNTER — Emergency Department (HOSPITAL_BASED_OUTPATIENT_CLINIC_OR_DEPARTMENT_OTHER): Payer: Self-pay

## 2021-08-02 ENCOUNTER — Encounter (HOSPITAL_BASED_OUTPATIENT_CLINIC_OR_DEPARTMENT_OTHER): Payer: Self-pay | Admitting: *Deleted

## 2021-08-02 ENCOUNTER — Other Ambulatory Visit: Payer: Self-pay

## 2021-08-02 ENCOUNTER — Emergency Department (HOSPITAL_BASED_OUTPATIENT_CLINIC_OR_DEPARTMENT_OTHER)
Admission: EM | Admit: 2021-08-02 | Discharge: 2021-08-02 | Disposition: A | Discharge status: Home / Self Care | Payer: Self-pay | Attending: Emergency Medicine | Admitting: Emergency Medicine

## 2021-08-02 DIAGNOSIS — L539 Erythematous condition, unspecified: Secondary | ICD-10-CM | POA: Insufficient documentation

## 2021-08-02 DIAGNOSIS — M791 Myalgia, unspecified site: Secondary | ICD-10-CM | POA: Insufficient documentation

## 2021-08-02 DIAGNOSIS — R600 Localized edema: Secondary | ICD-10-CM | POA: Insufficient documentation

## 2021-08-02 DIAGNOSIS — R000 Tachycardia, unspecified: Secondary | ICD-10-CM | POA: Insufficient documentation

## 2021-08-02 DIAGNOSIS — R062 Wheezing: Secondary | ICD-10-CM | POA: Insufficient documentation

## 2021-08-02 DIAGNOSIS — F17210 Nicotine dependence, cigarettes, uncomplicated: Secondary | ICD-10-CM | POA: Insufficient documentation

## 2021-08-02 DIAGNOSIS — I10 Essential (primary) hypertension: Secondary | ICD-10-CM | POA: Insufficient documentation

## 2021-08-02 DIAGNOSIS — R059 Cough, unspecified: Secondary | ICD-10-CM | POA: Insufficient documentation

## 2021-08-02 DIAGNOSIS — R519 Headache, unspecified: Secondary | ICD-10-CM | POA: Insufficient documentation

## 2021-08-02 DIAGNOSIS — Z8616 Personal history of COVID-19: Secondary | ICD-10-CM | POA: Insufficient documentation

## 2021-08-02 DIAGNOSIS — R112 Nausea with vomiting, unspecified: Secondary | ICD-10-CM | POA: Insufficient documentation

## 2021-08-02 DIAGNOSIS — Z20822 Contact with and (suspected) exposure to covid-19: Secondary | ICD-10-CM | POA: Insufficient documentation

## 2021-08-02 DIAGNOSIS — R509 Fever, unspecified: Secondary | ICD-10-CM | POA: Insufficient documentation

## 2021-08-02 LAB — GROUP A STREP BY PCR: Group A Strep by PCR: NOT DETECTED

## 2021-08-02 LAB — RESP PANEL BY RT-PCR (FLU A&B, COVID) ARPGX2
Influenza A by PCR: NEGATIVE
Influenza B by PCR: NEGATIVE
SARS Coronavirus 2 by RT PCR: NEGATIVE

## 2021-08-02 MED ORDER — ONDANSETRON 4 MG PO TBDP
4.0000 mg | ORAL_TABLET | Freq: Once | ORAL | Status: AC
  Administered 2021-08-02: 4 mg via ORAL
  Filled 2021-08-02: qty 1

## 2021-08-02 MED ORDER — IBUPROFEN 800 MG PO TABS
800.0000 mg | ORAL_TABLET | Freq: Once | ORAL | Status: AC
  Administered 2021-08-02: 800 mg via ORAL
  Filled 2021-08-02: qty 1

## 2021-08-02 MED ORDER — ONDANSETRON 4 MG PO TBDP
4.0000 mg | ORAL_TABLET | Freq: Three times a day (TID) | ORAL | 0 refills | Status: AC | PRN
Start: 2021-08-02 — End: ?

## 2021-08-02 NOTE — ED Triage Notes (Signed)
Fever, cough and nausea x 3 days. She thinks she has Covid. She feels she has Covid again.

## 2021-08-02 NOTE — ED Notes (Signed)
D/c paperwork reviewed with pt, including prescriptions.  Pt verbalized understanding, with no questions or concerns at time of d/c. Ambulatory to ED exit.

## 2021-08-02 NOTE — ED Provider Notes (Signed)
[facility] EMERGENCY DEPARTMENT Provider Note   CSN: [id number] Arrival date & time: 08/02/21  1556     History Chief Complaint  Patient presents with   Fever   Cough    RILEYANNE Weeks is a 45 y.o.  female smoker presents the ED for subjective fever, dry cough, nausea, vomiting, myalgia, headaches for 2 days.  She reports 2 episodes of nonbilious nonbloody vomiting today.  She denies any chest pain, shortness of breath, abdominal pain, diarrhea, constipation, ear pain, sore throat, nasal congestion,  or rhinorrhea.  She reports she had COVID last month and her symptoms for the past 2 days feel like it again.  She has not tried any medications for her symptoms.  Denies any medical history, surgical history, or daily medications.  She is allergic to Percocet and penicillin.  She is a daily smoker.   Fever Associated symptoms: cough, headaches, myalgias, nausea and vomiting   Associated symptoms: no chest pain, no congestion, no diarrhea, no dysuria, no ear pain, no rash, no rhinorrhea and no sore throat   Cough Associated symptoms: fever (subjective), headaches and myalgias   Associated symptoms: no chest pain, no ear pain, no eye discharge, no rash, no rhinorrhea, no shortness of breath and no sore throat       Past Medical History:  Diagnosis Date   Active smoker    Anemia    Blood transfusion 2000   Blood transfusion without reported diagnosis    Chronic pelvic pain in female    Endometriosis    Fibroid, uterus    Headache(784.0)    Hypertension    Palpitations 02/13/2018    Patient Active Problem List   Diagnosis Date Noted   Hypokalemia 02/15/2018   Palpitations 02/13/2018   Allergy to pollen 09/17/2017   Anosmia 09/17/2017   Cigarette smoker 09/17/2017   Laceration of left index finger with foreign body and damage to nail 08/07/2016   Open nondisplaced fracture of distal phalanx of left index finger with routine healing 08/07/2016   Nicotine use  disorder 09/13/2015   Chronic low back pain 06/06/2015   Degeneration, intervertebral disc, cervical 06/06/2015   Neck pain 06/06/2015   Pain in joint 06/06/2015   History of hysterectomy 03/28/2015    Past Surgical History:  Procedure Laterality Date   ABDOMINAL HYSTERECTOMY  12/11/2011   Procedure: HYSTERECTOMY ABDOMINAL;  Surgeon: Margarette Asal, MD;  Location: [facility];  Service: Gynecology;  Laterality: N/A;   CYSTOSCOPY     GANGLION CYST EXCISION  age 16   right wrist   HYSTEROSCOPY WITH D & C  10-26-10   polypectomy   INCISION AND DRAINAGE OF WOUND Left 08/01/2016   Procedure: IRRIGATION AND DEBRIDEMENT WOUND;  Surgeon: Leanora Cover, MD;  Location: [facility];  Service: Orthopedics;  Laterality: Left;   LAPAROSCOPY  10/01/2011   Procedure: LAPAROSCOPY DIAGNOSTIC;  Surgeon: Margarette Asal;  Location: [facility];  Service: Gynecology;  Laterality: N/A;   SALPINGOOPHORECTOMY  12/11/2011   Procedure: SALPINGO OOPHERECTOMY;  Surgeon: Margarette Asal, MD;  Location: [facility];  Service: Gynecology;  Laterality: Bilateral;     OB History     Gravida  0   Para      Term      Preterm      AB      Living         SAB      IAB      Ectopic  Multiple      Live Births              Family History  Problem Relation Age of Onset   Diabetes Maternal Grandmother    Hypertension Maternal Grandmother    Heart disease Maternal Grandmother     Social History   Tobacco Use   Smoking status: Every Day    Packs/day: 0.50    Years: 6.00    Pack years: 3.00    Types: Cigarettes   Smokeless tobacco: Never  Vaping Use   Vaping Use: Some days  Substance Use Topics   Alcohol use: No   Drug use: No    Home Medications Prior to Admission medications   Medication Sig Start Date End Date Taking? Authorizing Provider  ondansetron (ZOFRAN ODT) 4 MG disintegrating tablet Take 1 tablet (4 mg total) by mouth every 8 (eight) hours as  needed for nausea or vomiting. 08/02/21  Yes Sherrell Puller, PA-C  famotidine (PEPCID) 20 MG tablet Take 1 tablet (20 mg total) by mouth 2 (two) times daily. 06/28/21 07/28/21  Wyvonnia Dusky, MD  fluticasone (FLONASE) 50 MCG/ACT nasal spray Place 2 sprays into both nostrils daily. 12/02/20   Henderly, Britni A, PA-C  fluticasone (FLONASE) 50 MCG/ACT nasal spray Place 2 sprays into both nostrils daily for 3 days. 06/28/21 07/01/21  Wyvonnia Dusky, MD  meclizine (ANTIVERT) 25 MG tablet Take 1 tablet (25 mg total) by mouth 3 (three) times daily as needed for dizziness. 09/15/18   Kinnie Feil, PA-C  potassium chloride (K-DUR) 10 MEQ tablet Take 4 tablets (40 mEq total) by mouth daily for 7 days. 09/15/18 09/22/18  Kinnie Feil, PA-C  potassium chloride SA (K-DUR,KLOR-CON) 20 MEQ tablet Take 1 tablet (20 mEq total) by mouth daily. 02/05/18   Molpus, John, MD    Allergies    Penicillin g and Percocet [oxycodone-acetaminophen]  Review of Systems   Review of Systems  Constitutional:  Positive for fatigue and fever (subjective).  HENT:  Negative for congestion, ear pain, rhinorrhea and sore throat.   Eyes:  Negative for discharge and redness.  Respiratory:  Positive for cough. Negative for shortness of breath.   Cardiovascular:  Negative for chest pain and palpitations.  Gastrointestinal:  Positive for nausea and vomiting. Negative for abdominal pain, constipation and diarrhea.  Genitourinary:  Negative for dysuria and hematuria.  Musculoskeletal:  Positive for myalgias. Negative for arthralgias.  Skin:  Negative for color change and rash.  Neurological:  Positive for headaches. Negative for syncope.   Physical Exam Updated Vital Signs BP 129/83 (BP Location: Right Arm)   Pulse (!) 115   Temp 98.5 F (36.9 C) (Oral)   Resp 18   Ht '5\' 7"'$  (1.702 m)   Wt 92.8 kg   LMP 09/15/2011   SpO2 98%   BMI 32.04 kg/m   Physical Exam Vitals and nursing note reviewed.  Constitutional:       Appearance: Normal appearance.  HENT:     Head: Normocephalic and atraumatic.     Right Ear: Tympanic membrane, ear canal and external ear normal.     Left Ear: Tympanic membrane, ear canal and external ear normal.     Nose:     Comments: Mild erythema and edema to bilateral nares with scant clear nasal discharge noted    Mouth/Throat:     Mouth: Mucous membranes are moist.     Comments: Moist mucous membranes.  Posterior oropharynx is slightly erythematous. tonsils 1+.  No lesions or exudate noted.  Uvula midline. Eyes:     General: No scleral icterus. Cardiovascular:     Rate and Rhythm: Regular rhythm. Tachycardia present.  Pulmonary:     Effort: Pulmonary effort is normal. No respiratory distress.     Comments: Mild expiratory wheezing bilaterally.  Patient's respiratory rate is 16.  She speaking full sentences.  There is no accessory muscle use, nasal flaring, tripoding. Abdominal:     General: Abdomen is flat. Bowel sounds are normal.     Palpations: Abdomen is soft.  Musculoskeletal:        General: No deformity.     Cervical back: Normal range of motion.  Skin:    General: Skin is warm and dry.  Neurological:     General: No focal deficit present.     Mental Status: She is alert. Mental status is at baseline.    ED Results / Procedures / Treatments   Labs (all labs ordered are listed, but only abnormal results are displayed) Labs Reviewed  RESP PANEL BY RT-PCR (FLU A&B, COVID) [id number]  GROUP A STREP BY PCR    EKG None  Radiology DG Chest Port 1 View  Result Date: 08/02/2021 CLINICAL DATA:  Fever, cough, nausea for 3 days EXAM: PORTABLE CHEST 1 VIEW COMPARISON:  06/23/2021 FINDINGS: The heart size and mediastinal contours are within normal limits. Both lungs are clear. The visualized skeletal structures are unremarkable. IMPRESSION: No active disease. Electronically Signed   By: Randa Ngo M.D.   On: 08/02/2021 19:01    Procedures Procedures   Medications  Ordered in ED Medications  ibuprofen (ADVIL) tablet 800 mg (800 mg Oral Given 08/02/21 1817)  ondansetron (ZOFRAN-ODT) disintegrating tablet 4 mg (4 mg Oral Given 08/02/21 1855)    ED Course  I have reviewed the triage vital signs and the nursing notes.  Pertinent labs & imaging results that were available during my care of the patient were reviewed by me and considered in my medical decision making (see chart for details).  Stacey Weeks is a 45 y.o.  female smoker presents the ED for subjective fever, dry cough, nausea, vomiting, myalgia, headaches for 2 days.  Differential includes COVID, flu, strep, viral illness, peritonsillar abscess, PE, pneumonia.  I personally reviewed and interpreted the patient's labs and imaging.  Viral panel negative for flu.  Strep test negative.  Chest x-ray shows no acute cardiopulmonary process.  Patient's vital signs are stable and she is afebrile in the ED.  Her nausea is well managed with the Zofran.  The patient is not experiencing any shortness of breath or chest pain, is not on any estrogen-based birth control, and/or has not been on on any long trips or travel.  Based on interview, patient symptomology, and physical exam, this is less likely to be a PE.  The patient's tonsils were 1+ and uvula was midline.  Airway patent.  She denies any sore throat.  Peritonsillar abscess less likely.  This patient most likely has a viral illness.  Will prescribe Zofran to manage nausea.  Work note given to return on the 19th.  Recommended hydration and controlling symptoms with Tylenol or ibuprofen.  Return precautions given for any chest pain, shortness of breath, fevers, new or worsening symptoms.  Patient agrees to plan.  Patient is stable being discharged home in good condition.    MDM Rules/Calculators/A&P  Final Clinical Impression(s) / ED Diagnoses Final diagnoses:  Cough    Rx / DC Orders ED  Discharge Orders          Ordered    ondansetron (ZOFRAN ODT)  4 MG disintegrating tablet  Every 8 hours PRN        08/02/21 1935             Sherrell Puller, [place] 08/03/21 [id number]    Tegeler, Gwenyth Allegra, MD 08/03/21 [phone]

## 2021-08-02 NOTE — Discharge Instructions (Addendum)
You can take Tylenol or ibuprofen for pain for pain and/or fever.  Make sure to keep hydrated with water.  Please return to the ED for any new or worsening symptoms as discussed.

## 2021-09-10 ENCOUNTER — Encounter (HOSPITAL_BASED_OUTPATIENT_CLINIC_OR_DEPARTMENT_OTHER): Payer: Self-pay

## 2021-09-10 ENCOUNTER — Other Ambulatory Visit: Payer: Self-pay

## 2021-09-10 DIAGNOSIS — R519 Headache, unspecified: Secondary | ICD-10-CM | POA: Insufficient documentation

## 2021-09-10 DIAGNOSIS — Z5321 Procedure and treatment not carried out due to patient leaving prior to being seen by health care provider: Secondary | ICD-10-CM | POA: Insufficient documentation

## 2021-09-10 NOTE — ED Triage Notes (Signed)
Pt c/o intermittent headache x 1 week. Went to PCP and given antibiotic for sinus infection.

## 2021-09-11 ENCOUNTER — Emergency Department (HOSPITAL_BASED_OUTPATIENT_CLINIC_OR_DEPARTMENT_OTHER)
Admission: EM | Admit: 2021-09-11 | Discharge: 2021-09-11 | Disposition: A | Discharge status: Home / Self Care | Payer: Medicaid Other | Attending: Emergency Medicine | Admitting: Emergency Medicine

## 2022-05-02 ENCOUNTER — Emergency Department (HOSPITAL_BASED_OUTPATIENT_CLINIC_OR_DEPARTMENT_OTHER)
Admission: EM | Admit: 2022-05-02 | Discharge: 2022-05-02 | Disposition: A | Discharge status: Home / Self Care | Payer: Managed Care, Other (non HMO) | Attending: Emergency Medicine | Admitting: Emergency Medicine

## 2022-05-02 ENCOUNTER — Encounter (HOSPITAL_BASED_OUTPATIENT_CLINIC_OR_DEPARTMENT_OTHER): Payer: Self-pay

## 2022-05-02 ENCOUNTER — Other Ambulatory Visit: Payer: Self-pay

## 2022-05-02 DIAGNOSIS — J321 Chronic frontal sinusitis: Secondary | ICD-10-CM | POA: Insufficient documentation

## 2022-05-02 DIAGNOSIS — J329 Chronic sinusitis, unspecified: Secondary | ICD-10-CM

## 2022-05-02 DIAGNOSIS — R519 Headache, unspecified: Secondary | ICD-10-CM | POA: Diagnosis present

## 2022-05-02 MED ORDER — METHYLPREDNISOLONE 4 MG PO TBPK: ORAL_TABLET | ORAL | 0 refills | Status: AC

## 2022-05-02 MED ORDER — CEFDINIR 300 MG PO CAPS: 300.0000 mg | ORAL_CAPSULE | Freq: Two times a day (BID) | ORAL | 0 refills | Status: AC

## 2022-05-02 NOTE — Discharge Instructions (Addendum)
You were seen in the emergency department for facial pain.  I think your symptoms are likely related to your sinuses.  Although this seems like a chronic problem, I believe that you have a flare.  I have given you a short course of antibiotics as well as steroids.  Its incredibly important that you follow-up with the ENT doctor, as they have longer-term solutions for your symptoms more than we can provide in the ER.

## 2022-05-02 NOTE — ED Triage Notes (Signed)
C/o headache/sinus pressure for a "long time", has improved but then started again Sunday.

## 2022-05-02 NOTE — ED Provider Notes (Signed)
[facility] EMERGENCY DEPARTMENT Provider Note   CSN: [id number] Arrival date & time: 05/02/22  1000     History  Chief Complaint  Patient presents with   Headache   Facial Pain    Stacey Weeks is a 46 y.o. female who presents the emergency department complaining of headache and left-sided facial pressure for "a long time", but worse for the past 5 days.  States that she takes prescribed nasal sprays, without relief.  Denies any fever.   Headache Associated symptoms: eye pain and sinus pressure   Associated symptoms: no fever        Home Medications Prior to Admission medications   Medication Sig Start Date End Date Taking? Authorizing Provider  cefdinir (OMNICEF) 300 MG capsule Take 1 capsule (300 mg total) by mouth 2 (two) times daily for 5 days. 05/02/22 05/07/22 Yes Maximiano Lott T, PA-C  methylPREDNISolone (MEDROL DOSEPAK) 4 MG TBPK tablet Take per package instructions 05/02/22  Yes Anikah Hogge T, PA-C  famotidine (PEPCID) 20 MG tablet Take 1 tablet (20 mg total) by mouth 2 (two) times daily. 06/28/21 07/28/21  Wyvonnia Dusky, MD  fluticasone (FLONASE) 50 MCG/ACT nasal spray Place 2 sprays into both nostrils daily. 12/02/20   Henderly, Britni A, PA-C  fluticasone (FLONASE) 50 MCG/ACT nasal spray Place 2 sprays into both nostrils daily for 3 days. 06/28/21 07/01/21  Wyvonnia Dusky, MD  meclizine (ANTIVERT) 25 MG tablet Take 1 tablet (25 mg total) by mouth 3 (three) times daily as needed for dizziness. 09/15/18   Kinnie Feil, PA-C  ondansetron (ZOFRAN ODT) 4 MG disintegrating tablet Take 1 tablet (4 mg total) by mouth every 8 (eight) hours as needed for nausea or vomiting. 08/02/21   Sherrell Puller, PA-C  potassium chloride (K-DUR) 10 MEQ tablet Take 4 tablets (40 mEq total) by mouth daily for 7 days. 09/15/18 09/22/18  Kinnie Feil, PA-C  potassium chloride SA (K-DUR,KLOR-CON) 20 MEQ tablet Take 1 tablet (20 mEq total) by mouth daily. 02/05/18    Molpus, Jenny Reichmann, MD      Allergies    Penicillin g and Percocet [oxycodone-acetaminophen]    Review of Systems   Review of Systems  Constitutional:  Negative for fever.  HENT:  Positive for sinus pressure and sinus pain.   Eyes:  Positive for pain. Negative for visual disturbance.  Neurological:  Positive for headaches. Negative for syncope and light-headedness.  All other systems reviewed and are negative.   Physical Exam Updated Vital Signs BP (!) 116/92 (BP Location: Left Arm)   Pulse 74   Temp 98.4 F (36.9 C) (Oral)   Resp 18   Ht '5\' 7"'$  (1.702 m)   Wt 95.3 kg   LMP 12/14/2010   SpO2 100%   BMI 32.89 kg/m  Physical Exam Vitals and nursing note reviewed.  Constitutional:      Appearance: Normal appearance.  HENT:     Head: Normocephalic and atraumatic.     Nose:     Left Sinus: Frontal sinus tenderness present.  Eyes:     Extraocular Movements: Extraocular movements intact.     Conjunctiva/sclera: Conjunctivae normal.  Pulmonary:     Effort: Pulmonary effort is normal. No respiratory distress.  Skin:    General: Skin is warm and dry.  Neurological:     Mental Status: She is alert.  Psychiatric:        Mood and Affect: Mood normal.        Behavior: Behavior normal.  ED Results / Procedures / Treatments   Labs (all labs ordered are listed, but only abnormal results are displayed) Labs Reviewed - No data to display  EKG None  Radiology No results found.  Procedures Procedures    Medications Ordered in ED Medications - No data to display  ED Course/ Medical Decision Making/ A&P                           Medical Decision Making Risk Prescription drug management.   This patient is a 46 y.o. female who presents to the ED for concern of chronic sinus pressure, worse x 5 days.   Differential diagnoses prior to evaluation: Chronic sinusitis, bacterial rhinosinusitis, viral infection, nasal polyp  Past Medical History / Social History /  Additional history: Chart reviewed. Pertinent results include: Previously seen [facility] ENT  Physical Exam: Physical exam performed. The pertinent findings include: Left-sided frontal sinus pressure to palpation.  Normal vital signs.   Disposition: After consideration of the diagnostic results and the patients response to treatment, I feel that patient's not requiring admission.  Concern for chronic sinusitis, with questionable acute flare.  As she is not having improvement with intranasal steroids, will prescribe short course of antibiotics and systemic steroids and recommend follow-up with ENT. Discussed reasons to return to the emergency department, and the patient is agreeable to the plan.  Final Clinical Impression(s) / ED Diagnoses Final diagnoses:  Chronic sinusitis, unspecified location    Rx / DC Orders ED Discharge Orders          Ordered    cefdinir (OMNICEF) 300 MG capsule  2 times daily        05/02/22 1343    methylPREDNISolone (MEDROL DOSEPAK) 4 MG TBPK tablet        05/02/22 1343           Portions of this report may have been transcribed using voice recognition software. Every effort was made to ensure accuracy; however, inadvertent computerized transcription errors may be present.    Estill Cotta 05/02/22 [place], Ankit, MD 05/03/22 [phone]

## 2022-05-10 ENCOUNTER — Emergency Department (HOSPITAL_COMMUNITY)
Admission: EM | Admit: 2022-05-10 | Discharge: 2022-05-10 | Discharge status: Against Medical Advice | Payer: Managed Care, Other (non HMO) | Attending: Emergency Medicine | Admitting: Emergency Medicine

## 2022-05-10 ENCOUNTER — Encounter (HOSPITAL_COMMUNITY): Payer: Self-pay

## 2022-05-10 ENCOUNTER — Other Ambulatory Visit: Payer: Self-pay

## 2022-05-10 DIAGNOSIS — G501 Atypical facial pain: Secondary | ICD-10-CM | POA: Diagnosis not present

## 2022-05-10 DIAGNOSIS — Z5321 Procedure and treatment not carried out due to patient leaving prior to being seen by health care provider: Secondary | ICD-10-CM | POA: Insufficient documentation

## 2022-05-10 NOTE — ED Triage Notes (Signed)
Pt arrived POV from home c/o left sided facial pain d/t sinusitis. Pt states she went to the hospital last week and they gave her a steroid and antibiotic she has finished those and still no relief.

## 2022-11-05 ENCOUNTER — Emergency Department (HOSPITAL_BASED_OUTPATIENT_CLINIC_OR_DEPARTMENT_OTHER)
Admission: EM | Admit: 2022-11-05 | Discharge: 2022-11-05 | Disposition: A | Discharge status: Home / Self Care | Payer: Medicaid Other | Attending: Emergency Medicine | Admitting: Emergency Medicine

## 2022-11-05 ENCOUNTER — Other Ambulatory Visit: Payer: Self-pay

## 2022-11-05 ENCOUNTER — Emergency Department (HOSPITAL_BASED_OUTPATIENT_CLINIC_OR_DEPARTMENT_OTHER): Payer: Medicaid Other

## 2022-11-05 ENCOUNTER — Encounter (HOSPITAL_BASED_OUTPATIENT_CLINIC_OR_DEPARTMENT_OTHER): Payer: Self-pay | Admitting: Emergency Medicine

## 2022-11-05 DIAGNOSIS — Z1152 Encounter for screening for COVID-19: Secondary | ICD-10-CM | POA: Insufficient documentation

## 2022-11-05 DIAGNOSIS — Z79899 Other long term (current) drug therapy: Secondary | ICD-10-CM | POA: Insufficient documentation

## 2022-11-05 DIAGNOSIS — R220 Localized swelling, mass and lump, head: Secondary | ICD-10-CM

## 2022-11-05 DIAGNOSIS — I10 Essential (primary) hypertension: Secondary | ICD-10-CM | POA: Insufficient documentation

## 2022-11-05 DIAGNOSIS — J069 Acute upper respiratory infection, unspecified: Secondary | ICD-10-CM | POA: Insufficient documentation

## 2022-11-05 DIAGNOSIS — K148 Other diseases of tongue: Secondary | ICD-10-CM | POA: Insufficient documentation

## 2022-11-05 DIAGNOSIS — M940 Chondrocostal junction syndrome [Tietze]: Secondary | ICD-10-CM

## 2022-11-05 LAB — RESP PANEL BY RT-PCR (RSV, FLU A&B, COVID)  RVPGX2
Influenza A by PCR: NEGATIVE
Influenza B by PCR: NEGATIVE
Resp Syncytial Virus by PCR: NEGATIVE
SARS Coronavirus 2 by RT PCR: NEGATIVE

## 2022-11-05 LAB — GROUP A STREP BY PCR: Group A Strep by PCR: NOT DETECTED

## 2022-11-05 MED ORDER — LIDOCAINE 4 % EX PTCH: 2.0000 | MEDICATED_PATCH | CUTANEOUS | 0 refills | Status: DC

## 2022-11-05 MED ORDER — DIPHENHYDRAMINE HCL 25 MG PO CAPS
25.0000 mg | ORAL_CAPSULE | Freq: Once | ORAL | Status: AC
  Administered 2022-11-05: 25 mg via ORAL
  Filled 2022-11-05: qty 1

## 2022-11-05 MED ORDER — LIDOCAINE 5 % EX PTCH
2.0000 | MEDICATED_PATCH | CUTANEOUS | Status: DC
  Administered 2022-11-05: 2 via TRANSDERMAL
  Filled 2022-11-05: qty 2

## 2022-11-05 MED ORDER — IBUPROFEN 400 MG PO TABS
400.0000 mg | ORAL_TABLET | Freq: Once | ORAL | Status: AC
  Administered 2022-11-05: 400 mg via ORAL
  Filled 2022-11-05: qty 1

## 2022-11-05 MED ORDER — LIDOCAINE 4 % EX PTCH: 2.0000 | MEDICATED_PATCH | CUTANEOUS | 0 refills | Status: AC

## 2022-11-05 NOTE — Discharge Instructions (Addendum)
Please follow-up with your primary care doctor and ENT.  You tested negative for COVID/flu/RSV, make sure you are drinking lots of fluids, and using lidocaine patches for pain control.  You can also use some intranasal saline to help with rinsing of your nose.  If you develop any shortness of breath, difficulty speaking full sentences, or feeling that your throat was closing and please return to the ER.  Please talk to your primary care doctor, about stopping your losartan, as this may be contributing to your tongue swelling, I have also referred you to ENT for this as well.

## 2022-11-05 NOTE — ED Provider Notes (Signed)
[facility] EMERGENCY DEPARTMENT Provider Note   CSN: [id number] Arrival date & time: 11/05/22  0940     History  Chief Complaint  Patient presents with   Cough    Stacey Weeks is a 46 y.o. female, history of hypertension, presents to the ED secondary to feelings of fatigue for the last week, with new onset cough for the last 4 days, and development of bilateral rib cage pain.  She states she just feels unwell, and very weak.  States it hurts when she coughs, and that she feels like she needs to cough something up cannot but cannot.  Also endorses swollen tongue for several months, and states she has not tried any new medications, foods.  Denies any shortness of breath, or difficulty swallowing.  She does state that she does take losartan, and she started that several months ago.  Denies any recent rash.     Home Medications Prior to Admission medications   Medication Sig Start Date End Date Taking? Authorizing Provider  lidocaine (HM LIDOCAINE PATCH) 4 % Place 2 patches onto the skin daily. 11/05/22  Yes Alasha Mcguinness L, PA  famotidine (PEPCID) 20 MG tablet Take 1 tablet (20 mg total) by mouth 2 (two) times daily. 06/28/21 07/28/21  Wyvonnia Dusky, MD  fluticasone (FLONASE) 50 MCG/ACT nasal spray Place 2 sprays into both nostrils daily. 12/02/20   Henderly, Britni A, PA-C  fluticasone (FLONASE) 50 MCG/ACT nasal spray Place 2 sprays into both nostrils daily for 3 days. 06/28/21 07/01/21  Wyvonnia Dusky, MD  meclizine (ANTIVERT) 25 MG tablet Take 1 tablet (25 mg total) by mouth 3 (three) times daily as needed for dizziness. 09/15/18   Kinnie Feil, PA-C  methylPREDNISolone (MEDROL DOSEPAK) 4 MG TBPK tablet Take per package instructions 05/02/22   Roemhildt, Lorin T, PA-C  ondansetron (ZOFRAN ODT) 4 MG disintegrating tablet Take 1 tablet (4 mg total) by mouth every 8 (eight) hours as needed for nausea or vomiting. 08/02/21   Sherrell Puller, PA-C  potassium chloride  (K-DUR) 10 MEQ tablet Take 4 tablets (40 mEq total) by mouth daily for 7 days. 09/15/18 09/22/18  Kinnie Feil, PA-C  potassium chloride SA (K-DUR,KLOR-CON) 20 MEQ tablet Take 1 tablet (20 mEq total) by mouth daily. 02/05/18   Molpus, Jenny Reichmann, MD      Allergies    Penicillin g and Percocet [oxycodone-acetaminophen]    Review of Systems   Review of Systems  Constitutional:  Positive for fatigue.  Respiratory:  Positive for cough. Negative for shortness of breath.     Physical Exam Updated Vital Signs BP (!) 114/55   Pulse 92   Temp 98 F (36.7 C) (Oral)   Resp 18   Ht '5\' 7"'$  (1.702 m)   LMP 12/14/2010   SpO2 93%   BMI 33.36 kg/m  Physical Exam Vitals and nursing note reviewed.  Constitutional:      General: She is not in acute distress.    Appearance: She is well-developed.  HENT:     Head: Normocephalic and atraumatic.     Mouth/Throat:     Mouth: Mucous membranes are moist.     Comments: +moderate edematous tongue w/o deviation of uvula or respiratory distress. Uvula midline.  Eyes:     Conjunctiva/sclera: Conjunctivae normal.  Cardiovascular:     Rate and Rhythm: Normal rate and regular rhythm.     Heart sounds: No murmur heard. Pulmonary:     Effort: Pulmonary effort is normal. No  respiratory distress.     Breath sounds: Normal breath sounds.  Abdominal:     Palpations: Abdomen is soft.     Tenderness: There is no abdominal tenderness.  Musculoskeletal:        General: No swelling.     Cervical back: Neck supple.     Comments: +TTP under bilateral breasts along rib cage  Skin:    General: Skin is warm and dry.     Capillary Refill: Capillary refill takes less than 2 seconds.  Neurological:     Mental Status: She is alert.  Psychiatric:        Mood and Affect: Mood normal.     ED Results / Procedures / Treatments   Labs (all labs ordered are listed, but only abnormal results are displayed) Labs Reviewed  GROUP A STREP BY PCR  RESP PANEL BY RT-PCR  (RSV, FLU A&B, COVID)  [id number]    EKG None  Radiology DG Chest 2 View  Result Date: 11/05/2022 CLINICAL DATA:  Cough for the past week. EXAM: CHEST - 2 VIEW COMPARISON:  Chest x-ray dated August 02, 2021. FINDINGS: The heart size and mediastinal contours are within normal limits. Both lungs are clear. The visualized skeletal structures are unremarkable. IMPRESSION: No active cardiopulmonary disease. Electronically Signed   By: Titus Dubin M.D.   On: 11/05/2022 10:47    Procedures Procedures    Medications Ordered in ED Medications  lidocaine (LIDODERM) 5 % 2 patch (2 patches Transdermal Patch Applied 11/05/22 1144)  ibuprofen (ADVIL) tablet 400 mg (400 mg Oral Given 11/05/22 1143)  diphenhydrAMINE (BENADRYL) capsule 25 mg (25 mg Oral Given 11/05/22 1143)    ED Course/ Medical Decision Making/ A&P                           Medical Decision Making Patient is a 46 year old female, here for URI symptoms, rib cage pain for the last week.  Lungs are fairly clear to auscultation, and she has a dry cough on exam, we will obtain a chest x-ray, and COVID/flu testing for further evaluation.  Additionally she complains of tongue swelling this morning for several months, she is on losartan, and recently started that again 6+ months ago, I believe that likely this is secondary to losartan use, and discussed ceasing this, and following up with PCP for new hypertension meds.  Amount and/or Complexity of Data Reviewed Labs:     Details: COVID/flu/RSV negative. Radiology: ordered.    Details: Chest x-ray clear Discussion of management or test interpretation with external provider(s): Discussed with patient, negative findings, discussed URI symptom control, and using intranasal saline as needed for rinsing of nostrils.  Additionally discussed tongue swelling, Dr. Maryan Rued evaluated patient, recommended that she follow-up with ENT, and also sees her losartan to rule this out.  It seems like she  has a long standing history of ENT issues, thus she would like a second opinion thus the ENT referral was made.    Final Clinical Impression(s) / ED Diagnoses Final diagnoses:  Viral upper respiratory tract infection  Tongue swelling    Rx / DC Orders ED Discharge Orders          Ordered    lidocaine (HM LIDOCAINE PATCH) 4 %  Every 24 hours        11/05/22 1150              Linsy Ehresman L, PA 11/05/22 1151    [place], [place],  MD 11/05/22 1500

## 2022-11-05 NOTE — ED Triage Notes (Signed)
Cough and URI symptoms x 1 week , ribs pain with coughing .

## 2023-04-10 ENCOUNTER — Emergency Department (HOSPITAL_BASED_OUTPATIENT_CLINIC_OR_DEPARTMENT_OTHER)
Admission: EM | Admit: 2023-04-10 | Discharge: 2023-04-10 | Disposition: A | Discharge status: Home / Self Care | Payer: BC Managed Care – PPO | Attending: Emergency Medicine | Admitting: Emergency Medicine

## 2023-04-10 ENCOUNTER — Other Ambulatory Visit: Payer: Self-pay

## 2023-04-10 ENCOUNTER — Encounter (HOSPITAL_BASED_OUTPATIENT_CLINIC_OR_DEPARTMENT_OTHER): Payer: Self-pay | Admitting: Emergency Medicine

## 2023-04-10 DIAGNOSIS — L500 Allergic urticaria: Secondary | ICD-10-CM | POA: Insufficient documentation

## 2023-04-10 DIAGNOSIS — R21 Rash and other nonspecific skin eruption: Secondary | ICD-10-CM | POA: Diagnosis present

## 2023-04-10 DIAGNOSIS — I10 Essential (primary) hypertension: Secondary | ICD-10-CM | POA: Insufficient documentation

## 2023-04-10 DIAGNOSIS — T7840XA Allergy, unspecified, initial encounter: Secondary | ICD-10-CM

## 2023-04-10 DIAGNOSIS — F17210 Nicotine dependence, cigarettes, uncomplicated: Secondary | ICD-10-CM | POA: Diagnosis not present

## 2023-04-10 DIAGNOSIS — Z79899 Other long term (current) drug therapy: Secondary | ICD-10-CM | POA: Insufficient documentation

## 2023-04-10 MED ORDER — DIPHENHYDRAMINE HCL 25 MG PO CAPS
50.0000 mg | ORAL_CAPSULE | Freq: Once | ORAL | Status: AC
  Administered 2023-04-10: 50 mg via ORAL
  Filled 2023-04-10: qty 2

## 2023-04-10 MED ORDER — FAMOTIDINE 20 MG PO TABS
20.0000 mg | ORAL_TABLET | Freq: Once | ORAL | Status: AC
  Administered 2023-04-10: 20 mg via ORAL
  Filled 2023-04-10: qty 1

## 2023-04-10 MED ORDER — DEXAMETHASONE SODIUM PHOSPHATE 10 MG/ML IJ SOLN
10.0000 mg | Freq: Once | INTRAMUSCULAR | Status: AC
  Administered 2023-04-10: 10 mg via INTRAMUSCULAR
  Filled 2023-04-10: qty 1

## 2023-04-10 NOTE — Discharge Instructions (Addendum)
You can get Benadryl (diphenhydramine) over-the-counter without a prescription.  Please take 2 tablets every 6 hours if hives and/or swelling recur.  If Benadryl makes you too drowsy you can take Zyrtec (cetirizine), also available over-the-counter without a prescription, 10 mg twice a day.

## 2023-04-10 NOTE — ED Triage Notes (Signed)
Pt states woke up this morning with face swollen. States has been having bumps/rash since Thursday, was seen [facility] for same and given benadryl and a shot.  Was better but started back yesterday.

## 2023-04-10 NOTE — ED Provider Notes (Signed)
[facility] Provider Note: Stacey Dell, MD, FACEP  CSN: [id number] MRN: [id number] ARRIVAL: 04/10/23 at 0542 ROOM: MH09/MH09   CHIEF COMPLAINT  Rash and Facial Swelling   HISTORY OF PRESENT ILLNESS  04/10/23 5:54 AM Stacey Weeks is a 47 y.o. female with an urticarial, pruritic rash for the past week.  She was seen at [facility] 04/07/2023 and diagnosed with urticaria.  She was given 10 mg of intramuscular dexamethasone and 50 mg of oral Benadryl.  She had some transient improvement but symptoms returned yesterday.  She has not taken any Benadryl or other antihistamines since being discharged from [facility] ED.  She is having some edema of the left cheek but no throat, tongue or lip swelling.  She denies difficulty breathing or swallowing.  She denies nausea, vomiting or diarrhea.   Past Medical History:  Diagnosis Date   Active smoker    Anemia    Blood transfusion 2000   Blood transfusion without reported diagnosis    Chronic pelvic pain in female    Endometriosis    Fibroid, uterus    Headache(784.0)    Hypertension    Palpitations 02/13/2018    Past Surgical History:  Procedure Laterality Date   ABDOMINAL HYSTERECTOMY  12/11/2011   Procedure: HYSTERECTOMY ABDOMINAL;  Surgeon: Meriel Pica, MD;  Location: [facility];  Service: Gynecology;  Laterality: N/A;   CYSTOSCOPY     GANGLION CYST EXCISION  age 64   right wrist   HYSTEROSCOPY WITH D & C  10-26-10   polypectomy   INCISION AND DRAINAGE OF WOUND Left 08/01/2016   Procedure: IRRIGATION AND DEBRIDEMENT WOUND;  Surgeon: Betha Loa, MD;  Location: [facility];  Service: Orthopedics;  Laterality: Left;   LAPAROSCOPY  10/01/2011   Procedure: LAPAROSCOPY DIAGNOSTIC;  Surgeon: Meriel Pica;  Location: [facility];  Service: Gynecology;  Laterality: N/A;   SALPINGOOPHORECTOMY  12/11/2011   Procedure: SALPINGO OOPHERECTOMY;  Surgeon: Meriel Pica, MD;  Location: [facility];   Service: Gynecology;  Laterality: Bilateral;    Family History  Problem Relation Age of Onset   Diabetes Maternal Grandmother    Hypertension Maternal Grandmother    Heart disease Maternal Grandmother     Social History   Tobacco Use   Smoking status: Every Day    Packs/day: 0.50    Years: 6.00    Additional pack years: 0.00    Total pack years: 3.00    Types: Cigarettes   Smokeless tobacco: Never  Vaping Use   Vaping Use: Some days  Substance Use Topics   Alcohol use: No   Drug use: No    Prior to Admission medications   Medication Sig Start Date End Date Taking? Authorizing Provider  famotidine (PEPCID) 20 MG tablet Take 1 tablet (20 mg total) by mouth 2 (two) times daily. 06/28/21 07/28/21  Terald Sleeper, MD  fluticasone (FLONASE) 50 MCG/ACT nasal spray Place 2 sprays into both nostrils daily. 12/02/20   Henderly, Britni A, PA-C  fluticasone (FLONASE) 50 MCG/ACT nasal spray Place 2 sprays into both nostrils daily for 3 days. 06/28/21 07/01/21  Terald Sleeper, MD  lidocaine (HM LIDOCAINE PATCH) 4 % Place 2 patches onto the skin daily. 11/05/22   Small, Brooke L, PA  meclizine (ANTIVERT) 25 MG tablet Take 1 tablet (25 mg total) by mouth 3 (three) times daily as needed for dizziness. 09/15/18   Liberty Handy, PA-C  methylPREDNISolone (MEDROL DOSEPAK) 4 MG TBPK  tablet Take per package instructions 05/02/22   Roemhildt, Lorin T, PA-C  ondansetron (ZOFRAN ODT) 4 MG disintegrating tablet Take 1 tablet (4 mg total) by mouth every 8 (eight) hours as needed for nausea or vomiting. 08/02/21   Achille Rich, PA-C  potassium chloride (K-DUR) 10 MEQ tablet Take 4 tablets (40 mEq total) by mouth daily for 7 days. 09/15/18 09/22/18  Liberty Handy, PA-C  potassium chloride SA (K-DUR,KLOR-CON) 20 MEQ tablet Take 1 tablet (20 mEq total) by mouth daily. 02/05/18   Alden Feagan, MD    Allergies Penicillin g and Percocet [oxycodone-acetaminophen]   REVIEW OF SYSTEMS  Negative except  as noted here or in the History of Present Illness.   PHYSICAL EXAMINATION  Initial Vital Signs Blood pressure (!) 140/84, pulse 84, temperature 97.8 F (36.6 C), temperature source Oral, resp. rate 18, height 5' 7.5" (1.715 m), weight 97.5 kg, last menstrual period 12/14/2010, SpO2 100 %.  Examination General: Well-developed, well-nourished female in no acute distress; appearance consistent with age of record HENT: normocephalic; atraumatic; mild edema of left cheek; no lip or oropharyngeal edema Eyes: Normal appearance Neck: supple Heart: regular rate and rhythm Lungs: clear to auscultation bilaterally Abdomen: soft; nondistended; nontender; bowel sounds present Extremities: No deformity; full range of motion Neurologic: Awake, alert and oriented; motor function intact in all extremities and symmetric; no facial droop Skin: Warm and dry; scattered urticarial rash Psychiatric: Normal mood and affect   RESULTS  Summary of this visit's results, reviewed and interpreted by myself:   EKG Interpretation  Date/Time:    Ventricular Rate:    PR Interval:    QRS Duration:   QT Interval:    QTC Calculation:   R Axis:     Text Interpretation:         Laboratory Studies: No results found for this or any previous visit (from the past 24 hour(s)). Imaging Studies: No results found.  ED COURSE and MDM  Nursing notes, initial and subsequent vitals signs, including pulse oximetry, reviewed and interpreted by myself.  Vitals:   04/10/23 0552  BP: (!) 140/84  Pulse: 84  Resp: 18  Temp: 97.8 F (36.6 C)  TempSrc: Oral  SpO2: 100%  Weight: 97.5 kg  Height: 5' 7.5" (1.715 m)   Medications  diphenhydrAMINE (BENADRYL) capsule 50 mg (has no administration in time range)  famotidine (PEPCID) tablet 20 mg (has no administration in time range)  dexamethasone (DECADRON) injection 10 mg (has no administration in time range)   The patient was advised that she may need to take  Benadryl every 6 hours for several days until this reaction resolves.  It is unclear what she is reacting to as she has not changed any cosmetics soaps, etc. She does not know of any food allergies.   PROCEDURES  Procedures   ED DIAGNOSES     ICD-10-CM   1. Allergic reaction, initial encounter  T78.40XA          Kentaro Alewine, Jonny Ruiz, MD 04/10/23 [id number]

## 2023-04-21 ENCOUNTER — Other Ambulatory Visit: Payer: Self-pay

## 2023-04-21 ENCOUNTER — Encounter (HOSPITAL_BASED_OUTPATIENT_CLINIC_OR_DEPARTMENT_OTHER): Payer: Self-pay

## 2023-04-21 ENCOUNTER — Emergency Department (HOSPITAL_BASED_OUTPATIENT_CLINIC_OR_DEPARTMENT_OTHER)
Admission: EM | Admit: 2023-04-21 | Discharge: 2023-04-21 | Disposition: A | Discharge status: Home / Self Care | Payer: BC Managed Care – PPO | Attending: Emergency Medicine | Admitting: Emergency Medicine

## 2023-04-21 DIAGNOSIS — R220 Localized swelling, mass and lump, head: Secondary | ICD-10-CM | POA: Diagnosis present

## 2023-04-21 DIAGNOSIS — T783XXA Angioneurotic edema, initial encounter: Secondary | ICD-10-CM | POA: Diagnosis not present

## 2023-04-21 MED ORDER — DEXAMETHASONE SODIUM PHOSPHATE 10 MG/ML IJ SOLN: 10.0000 mg | Freq: Once | INTRAMUSCULAR | Status: DC

## 2023-04-21 MED ORDER — PREDNISONE 10 MG (21) PO TBPK: ORAL_TABLET | Freq: Every day | ORAL | 0 refills | Status: AC

## 2023-04-21 MED ORDER — METHYLPREDNISOLONE SODIUM SUCC 125 MG IJ SOLR
125.0000 mg | Freq: Once | INTRAMUSCULAR | Status: AC
  Administered 2023-04-21: 125 mg via INTRAVENOUS
  Filled 2023-04-21: qty 2

## 2023-04-21 MED ORDER — PREDNISONE 10 MG (21) PO TBPK: ORAL_TABLET | Freq: Every day | ORAL | 0 refills | Status: DC

## 2023-04-21 MED ORDER — DIPHENHYDRAMINE HCL 50 MG/ML IJ SOLN
50.0000 mg | Freq: Once | INTRAMUSCULAR | Status: AC
  Administered 2023-04-21: 50 mg via INTRAVENOUS
  Filled 2023-04-21: qty 1

## 2023-04-21 NOTE — ED Triage Notes (Signed)
Pt reports to ED with complaints of an allergic reaction. Pt has a swollen lip and states that the reaction began after she rubbed benadryl on it. States that she is taking Losartan and thinks it may be the medication.

## 2023-04-21 NOTE — ED Provider Notes (Signed)
[place] [facility] Provider Note   CSN: [id number] Arrival date & time: 04/21/23  1807     History Chief Complaint  Patient presents with   Allergic Reaction    HPI Stacey Weeks is a 47 y.o. female presenting for facial swelling.  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  She is uncertain if it is an allergic reaction though she spontaneously endorsed concern for her losartan causing this facial swelling due to family history of similar.  Denies any sensation of fullness in her mouth or throat.  Denies any other symptoms besides lower lip swelling.  Denies any recent trauma.  Denies fevers chills nausea vomiting syncope or shortness of breath..   Patient's recorded medical, surgical, social, medication list and allergies were reviewed in the Snapshot window as part of the initial history.   Review of Systems   Review of Systems  Constitutional:  Negative for chills and fever.  HENT:  Negative for ear pain and sore throat.   Eyes:  Negative for pain and visual disturbance.  Respiratory:  Negative for cough and shortness of breath.   Cardiovascular:  Negative for chest pain and palpitations.  Gastrointestinal:  Negative for abdominal pain and vomiting.  Genitourinary:  Negative for dysuria and hematuria.  Musculoskeletal:  Negative for arthralgias and back pain.  Skin:  Negative for color change and rash.  Neurological:  Negative for seizures and syncope.  All other systems reviewed and are negative.   Physical Exam Updated Vital Signs BP (!) 145/85   Pulse 73   Temp 98.3 F (36.8 C) (Oral)   Resp 16   Ht 5\' 7"  (1.702 m)   Wt 104.3 kg   LMP 12/14/2010   SpO2 97%   BMI 36.02 kg/m  Physical Exam Vitals and nursing note reviewed.  Constitutional:      General: She is not in acute distress.    Appearance: She is well-developed.  HENT:     Head:  Normocephalic and atraumatic.  Eyes:     Conjunctiva/sclera: Conjunctivae normal.  Cardiovascular:     Rate and Rhythm: Normal rate and regular rhythm.     Heart sounds: No murmur heard. Pulmonary:     Effort: Pulmonary effort is normal. No respiratory distress.     Breath sounds: Normal breath sounds.  Abdominal:     General: There is no distension.     Palpations: Abdomen is soft.     Tenderness: There is no abdominal tenderness. There is no right CVA tenderness or left CVA tenderness.  Musculoskeletal:        General: Swelling (Substantial amount of lower lip swelling.  No soft palate or hard palate involvement.  No tongue involvement.) present. No tenderness. Normal range of motion.     Cervical back: Neck supple.  Skin:    General: Skin is warm and dry.  Neurological:     General: No focal deficit present.     Mental Status: She is alert and oriented to person, place, and time. Mental status is at baseline.     Cranial Nerves: No cranial nerve deficit.      ED Course/ Medical Decision Making/ A&P    Procedures Procedures   Medications Ordered in ED Medications  diphenhydrAMINE (BENADRYL) injection 50 mg (50 mg Intravenous Given 04/21/23 1846)  methylPREDNISolone sodium succinate (SOLU-MEDROL) 125 mg/2 mL injection 125 mg (125 mg Intravenous  Given 04/21/23 1847)    Medical Decision Making:   Ms. Loeffler is presenting with a chief complaint of facial swelling.  Differential includes anaphylaxis versus angioedema.  Patient observed on telemetry which was reviewed periodically. Patient observed in emergency department for 4 hours after administration of Solu-Medrol and Benadryl and had interval improvement.  She is resting comfortably.  I believe this patient can be safely followed up in the outpatient setting.  Will continue on prednisone daily and Benadryl every 6 hours with the plan for the patient to follow-up closely with her primary care provider in the outpatient  setting.  Disposition:  I have considered need for hospitalization, however, considering all of the above, I believe this patient is stable for discharge at this time.  Patient/family educated about specific return precautions for given chief complaint and symptoms.  Patient/family educated about follow-up with PCP.     Patient/family expressed understanding of return precautions and need for follow-up. Patient spoken to regarding all imaging and laboratory results and appropriate follow up for these results. All education provided in verbal form with additional information in written form. Time was allowed for answering of patient questions. Patient discharged.    Emergency Department Medication Summary:   Medications  diphenhydrAMINE (BENADRYL) injection 50 mg (50 mg Intravenous Given 04/21/23 1846)  methylPREDNISolone sodium succinate (SOLU-MEDROL) 125 mg/2 mL injection 125 mg (125 mg Intravenous Given 04/21/23 1847)           Clinical Impression:  1. Angioedema, initial encounter      Discharge   Final Clinical Impression(s) / ED Diagnoses Final diagnoses:  Angioedema, initial encounter    Rx / DC Orders ED Discharge Orders          Ordered    predniSONE (STERAPRED UNI-PAK 21 TAB) 10 MG (21) TBPK tablet  Daily,   Status:  Discontinued        04/21/23 2252    predniSONE (STERAPRED UNI-PAK 21 TAB) 10 MG (21) TBPK tablet  Daily        04/21/23 2311              Glyn Ade, MD 04/21/23 2313

## 2023-04-21 NOTE — Discharge Instructions (Signed)
You were seen today for angioedema.  Fortunately your symptoms are improving at this time.  I will prescribe you a steroid taper you need to follow-up with your primary care provider and I would recommend stopping losartan until you are seen by them. Please return with any interval worsening make the opportunity participate in your care, Glyn Ade MD

## 2023-04-29 ENCOUNTER — Ambulatory Visit: Payer: BC Managed Care – PPO | Admitting: Internal Medicine

## 2023-05-19 ENCOUNTER — Encounter (HOSPITAL_BASED_OUTPATIENT_CLINIC_OR_DEPARTMENT_OTHER): Payer: Self-pay | Admitting: Urology

## 2023-05-19 ENCOUNTER — Emergency Department (HOSPITAL_BASED_OUTPATIENT_CLINIC_OR_DEPARTMENT_OTHER)
Admission: EM | Admit: 2023-05-19 | Discharge: 2023-05-19 | Disposition: A | Discharge status: Home / Self Care | Payer: BC Managed Care – PPO | Attending: Emergency Medicine | Admitting: Emergency Medicine

## 2023-05-19 ENCOUNTER — Other Ambulatory Visit: Payer: Self-pay

## 2023-05-19 DIAGNOSIS — D72829 Elevated white blood cell count, unspecified: Secondary | ICD-10-CM | POA: Insufficient documentation

## 2023-05-19 DIAGNOSIS — R7309 Other abnormal glucose: Secondary | ICD-10-CM | POA: Insufficient documentation

## 2023-05-19 DIAGNOSIS — B029 Zoster without complications: Secondary | ICD-10-CM

## 2023-05-19 DIAGNOSIS — I10 Essential (primary) hypertension: Secondary | ICD-10-CM | POA: Diagnosis not present

## 2023-05-19 DIAGNOSIS — E876 Hypokalemia: Secondary | ICD-10-CM | POA: Diagnosis not present

## 2023-05-19 DIAGNOSIS — R21 Rash and other nonspecific skin eruption: Secondary | ICD-10-CM | POA: Diagnosis present

## 2023-05-19 LAB — CBC WITH DIFFERENTIAL/PLATELET
Abs Immature Granulocytes: 0.04 10*3/uL (ref 0.00–0.07)
Basophils Absolute: 0 10*3/uL (ref 0.0–0.1)
Basophils Relative: 0 %
Eosinophils Absolute: 0.2 10*3/uL (ref 0.0–0.5)
Eosinophils Relative: 2 %
HCT: 43.2 % (ref 36.0–46.0)
Hemoglobin: 14.3 g/dL (ref 12.0–15.0)
Immature Granulocytes: 0 %
Lymphocytes Relative: 28 %
Lymphs Abs: 3.4 10*3/uL (ref 0.7–4.0)
MCH: 29.7 pg (ref 26.0–34.0)
MCHC: 33.1 g/dL (ref 30.0–36.0)
MCV: 89.6 fL (ref 80.0–100.0)
Monocytes Absolute: 1.1 10*3/uL — ABNORMAL HIGH (ref 0.1–1.0)
Monocytes Relative: 9 %
Neutro Abs: 7.5 10*3/uL (ref 1.7–7.7)
Neutrophils Relative %: 61 %
Platelets: 311 10*3/uL (ref 150–400)
RBC: 4.82 MIL/uL (ref 3.87–5.11)
RDW: 13.2 % (ref 11.5–15.5)
WBC: 12.4 10*3/uL — ABNORMAL HIGH (ref 4.0–10.5)
nRBC: 0 % (ref 0.0–0.2)

## 2023-05-19 LAB — COMPREHENSIVE METABOLIC PANEL
ALT: 15 U/L (ref 0–44)
AST: 13 U/L — ABNORMAL LOW (ref 15–41)
Albumin: 4.3 g/dL (ref 3.5–5.0)
Alkaline Phosphatase: 76 U/L (ref 38–126)
Anion gap: 9 (ref 5–15)
BUN: 13 mg/dL (ref 6–20)
CO2: 28 mmol/L (ref 22–32)
Calcium: 9.4 mg/dL (ref 8.9–10.3)
Chloride: 103 mmol/L (ref 98–111)
Creatinine, Ser: 0.84 mg/dL (ref 0.44–1.00)
GFR, Estimated: >60 mL/min (ref >60)
Glucose, Bld: 100 mg/dL — ABNORMAL HIGH (ref 70–99)
Potassium: 3.3 mmol/L — ABNORMAL LOW (ref 3.5–5.1)
Sodium: 140 mmol/L (ref 135–145)
Total Bilirubin: 0.4 mg/dL (ref 0.3–1.2)
Total Protein: 7.5 g/dL (ref 6.5–8.1)

## 2023-05-19 MED ORDER — FLUORESCEIN SODIUM 1 MG OP STRP
1.0000 | ORAL_STRIP | Freq: Once | OPHTHALMIC | Status: AC
  Administered 2023-05-19: 1 via OPHTHALMIC
  Filled 2023-05-19: qty 1

## 2023-05-19 MED ORDER — TETRACAINE HCL 0.5 % OP SOLN
2.0000 [drp] | Freq: Once | OPHTHALMIC | Status: AC
  Administered 2023-05-19: 2 [drp] via OPHTHALMIC
  Filled 2023-05-19: qty 4

## 2023-05-19 MED ORDER — VALACYCLOVIR HCL 1 G PO TABS: 1000.0000 mg | ORAL_TABLET | Freq: Three times a day (TID) | ORAL | 0 refills | Status: DC

## 2023-05-19 MED ORDER — VALACYCLOVIR HCL 500 MG PO TABS
1000.0000 mg | ORAL_TABLET | ORAL | Status: AC
  Administered 2023-05-19: 1000 mg via ORAL
  Filled 2023-05-19: qty 2

## 2023-05-19 MED ORDER — POTASSIUM CHLORIDE CRYS ER 20 MEQ PO TBCR
20.0000 meq | EXTENDED_RELEASE_TABLET | Freq: Once | ORAL | Status: AC
  Administered 2023-05-19: 20 meq via ORAL
  Filled 2023-05-19: qty 1

## 2023-05-19 MED ORDER — DIPHENHYDRAMINE HCL 25 MG PO TABS: 25.0000 mg | ORAL_TABLET | Freq: Four times a day (QID) | ORAL | 0 refills | Status: AC | PRN

## 2023-05-19 MED ORDER — OXYCODONE HCL 5 MG PO TABS: 5.0000 mg | ORAL_TABLET | ORAL | 0 refills | Status: AC | PRN

## 2023-05-19 MED ORDER — VALACYCLOVIR HCL 1 G PO TABS: 1000.0000 mg | ORAL_TABLET | Freq: Three times a day (TID) | ORAL | 0 refills | Status: AC

## 2023-05-19 NOTE — Discharge Instructions (Addendum)
You were seen in the ER today for evaluation of the rash on your face.  This is shingles.  This is concerning as a skin worsening get into your eye.  Because of this, I want you to follow-up with an eye doctor.  Please call the office today and he will see you tomorrow.  You like for you to call today to schedule an appointment for tomorrow.  Please make sure you do this.  In the meantime, given your first dose of valacyclovir while you are here.  Please take this every 8 hours for the next 7 days.  Additionally, I am sending you home with some narcotic pain medication.  Take as needed for breakthrough pain.  While sending out the Benadryl to use with this as well.  Otherwise, you can take 1000mg  of Tylenol and/or 600 mg of ibuprofen every 6 hours as needed for pain.  This is contagious, please make sure that you are not rubbing the area or spinning time around anyone immunocompromise, pediatric, or pregnant.  For your lymph nodes that are inflamed by you can place warm compresses to the area.  If you have any concerns, new or worsening symptoms, please return to the nearest emergency department for evaluation. Please follow up with your PCP on when you can return to work.   Contact a doctor if: Your pain does not get better with medicine. Your pain does not get better after the rash heals. You have any of these signs of infection around the rash: More redness, swelling, or pain. Fluid or blood. Warmth. Pus or a bad smell. You have a fever. Get help right away if: The rash is on your face or nose. You have pain in your face or pain by your eye. You lose feeling on one side of your face. You have trouble seeing. You have ear pain, or you have ringing in your ear. You have a loss of taste. Your condition gets worse.

## 2023-05-19 NOTE — ED Provider Notes (Addendum)
I provided a substantive portion of the care of this patient.  I personally made/approved the management plan for this patient and take responsibility for the patient management.     Patient started developing a blistering rash yesterday evening on her face.  Reports this morning it was worse.  She reports it is extremely painful to the touch.  Patient is alert with clear mental status.  She has a vesicular rash on her forehead and cheek on the left consistent with shingles.  Extraocular motions are normal.  Questionable mild injection of the left eye.  Denies any visual changes at this time.  Does have left-sided lymphadenopathy that is tender.  Will plan for consult with ophthalmology and fluorescein stain of the eye.  I agree with plan of management.  I discussed the patient's history of variable facial swelling and her prior use of losartan.  I reviewed extensive education about angioedema and ACE inhibitor's and ARB's, voices understanding.   Arby Barrette, MD 05/19/23 1017    Arby Barrette, MD 05/19/23 1018

## 2023-05-19 NOTE — ED Triage Notes (Signed)
Pt has rash to face, swelling to left side of face and right side of neck  Been having same concern since may intermittently  Pain to rash and left eye    NAD at this time, recent allergy testing with approx 12 new allergies Taking zyrtec, doxycycline, and prednisone with no relief

## 2023-05-19 NOTE — ED Provider Notes (Signed)
Stacey We[facility] AT [facility] Provider Note   CSN: [id number] Arrival date & time: 05/19/23  5284     History Chief Complaint  Patient presents with   Rash   Facial Swelling    Stacey Weeks is a 47 y.o. female with h/o HTN presents to the ER for evaluation of facial rash since yesterday.  Patient reports that she started noticing the bumps on the left side of her forehead.  She denies some blurry vision only when looking completely to the left and only has some blurry vision out of her lateral left eye, otherwise does not see any visual disturbance.  No pain with movement of her eye.  No fevers that she is noticed.  She has been on prednisone and doxycycline, her third day, given that she has been having random rashes and angioedema.  This was given to her by her primary care doctor.  She recently had allergy testing on Thursday with blood work that she is waiting for the results for.   Rash Associated symptoms: no abdominal pain, no fever, no nausea, no shortness of breath and not vomiting        Home Medications Prior to Admission medications   Medication Sig Start Date End Date Taking? Authorizing Provider  famotidine (PEPCID) 20 MG tablet Take 1 tablet (20 mg total) by mouth 2 (two) times daily. 06/28/21 07/28/21  Terald Sleeper, MD  fluticasone (FLONASE) 50 MCG/ACT nasal spray Place 2 sprays into both nostrils daily. 12/02/20   Henderly, Britni A, PA-C  fluticasone (FLONASE) 50 MCG/ACT nasal spray Place 2 sprays into both nostrils daily for 3 days. 06/28/21 07/01/21  Terald Sleeper, MD  lidocaine (HM LIDOCAINE PATCH) 4 % Place 2 patches onto the skin daily. 11/05/22   Small, Brooke L, PA  meclizine (ANTIVERT) 25 MG tablet Take 1 tablet (25 mg total) by mouth 3 (three) times daily as needed for dizziness. 09/15/18   Liberty Handy, PA-C  methylPREDNISolone (MEDROL DOSEPAK) 4 MG TBPK tablet Take per package instructions 05/02/22   Roemhildt, Lorin  T, PA-C  ondansetron (ZOFRAN ODT) 4 MG disintegrating tablet Take 1 tablet (4 mg total) by mouth every 8 (eight) hours as needed for nausea or vomiting. 08/02/21   Achille Rich, PA-C  potassium chloride (K-DUR) 10 MEQ tablet Take 4 tablets (40 mEq total) by mouth daily for 7 days. 09/15/18 09/22/18  Liberty Handy, PA-C  potassium chloride SA (K-DUR,KLOR-CON) 20 MEQ tablet Take 1 tablet (20 mEq total) by mouth daily. 02/05/18   Molpus, John, MD  predniSONE (STERAPRED UNI-PAK 21 TAB) 10 MG (21) TBPK tablet Take by mouth daily. Take 6 tabs by mouth daily  for 2 days, then 5 tabs for 2 days, then 4 tabs for 2 days, then 3 tabs for 2 days, 2 tabs for 2 days, then 1 tab by mouth daily for 2 days 04/21/23   Glyn Ade, MD      Allergies    Penicillin g and Percocet [oxycodone-acetaminophen]    Review of Systems   Review of Systems  Constitutional:  Negative for chills and fever.  HENT:  Negative for congestion and rhinorrhea.   Eyes:  Negative for photophobia and pain.  Respiratory:  Negative for shortness of breath.   Cardiovascular:  Negative for chest pain.  Gastrointestinal:  Negative for abdominal pain, nausea and vomiting.  Skin:  Positive for rash.    Physical Exam Updated Vital Signs BP (!) 144/92 (BP Location:  Left Arm)   Pulse 77   Temp 98.1 F (36.7 C) (Oral)   Resp 19   Ht 5\' 7"  (1.702 m)   Wt 104 kg   LMP 12/14/2010   SpO2 100%   BMI 35.91 kg/m  Physical Exam Vitals and nursing note reviewed.  Constitutional:      General: She is not in acute distress.    Appearance: She is not toxic-appearing.  HENT:     Head:     Comments: Vesicular rash seen in the V1 distribution on the left side of the face.  I do not appreciate any on the nose, in the nose, or in the ears.    Right Ear: Tympanic membrane, ear canal and external ear normal.     Left Ear: Tympanic membrane, ear canal and external ear normal.     Ears:     Comments: Does have some tender left-sided  lymphadenopathy but is preauricularly and submaxillary    Nose: Nose normal.     Mouth/Throat:     Mouth: Mucous membranes are moist.  Eyes:     General: Vision grossly intact.     Extraocular Movements: Extraocular movements intact.     Pupils: Pupils are equal, round, and reactive to light.     Comments: She does have some upper left lid swelling.  Denies any pain on movement of her eyes.  EOMI intact.  PERRLA.  On fluorescein exam, I do not appreciate any dendritic lesions or any corneal abrasions.  Fluorescein exam was unremarkable.  No hypopyon or hyphema.  Pressure in the eye was 28 and repeat was 23. Visual fields grossly intact.   Pulmonary:     Effort: Pulmonary effort is normal. No respiratory distress.  Musculoskeletal:     Cervical back: Normal range of motion.  Skin:    Capillary Refill: Capillary refill takes less than 2 seconds.  Neurological:     General: No focal deficit present.     Mental Status: She is alert.     Cranial Nerves: No cranial nerve deficit.     Sensory: No sensory deficit.     Comments: Cranial nerves intact.  No facial droop noted.         ED Results / Procedures / Treatments   Labs (all labs ordered are listed, but only abnormal results are displayed) Labs Reviewed  CBC WITH DIFFERENTIAL/PLATELET - Abnormal; Notable for the following components:      Result Value   WBC 12.4 (*)    Monocytes Absolute 1.1 (*)    All other components within normal limits  COMPREHENSIVE METABOLIC PANEL    EKG None  Radiology No results found.  Procedures Procedures   Medications Ordered in ED Medications  fluorescein ophthalmic strip 1 strip (1 strip Left Eye Given by Other 05/19/23 1015)  tetracaine (PONTOCAINE) 0.5 % ophthalmic solution 2 drop (2 drops Left Eye Given by Other 05/19/23 1016)    ED Course/ Medical Decision Making/ A&P                            Medical Decision Making Amount and/or Complexity of Data Reviewed Labs:  ordered.  Risk Prescription drug management.   47 y.o. female presents to the ER for evaluation of facial rash. Differential diagnosis includes but is not limited to shingles, SJS, TENS, conjunctivitis, Ramsay Hunt. Vital signs show elevated blood pressure slightly otherwise unremarkable. Physical exam as noted above.   Physical examination  concerning for shingles.  On fluorescein exam I do not appreciate any dendritic lesions.  There is no facial droop noted.  Cranial nerves appear to be intact.  I do not see any other shingles in the ear or in the nose.  Will consult ophthalmology.  The meantime, will obtain basic labs to check for kidney function for Valtrex.  I independently reviewed and interpreted the patient's labs.  CMP shows mildly decreased potassium 3 and 3, mildly of a glucose 100, mildly decreased AST at 13 otherwise no electrolyte or LFT abnormality.  CBC does show slightly elevated white blood cell count 12.4 however it is increase in monocyte count slightly by 1.1.  No anemia.  No increase in neutrophil count.  Visual fields grossly intact with my examination.  Still exam shows 20/40 with the right eye and 20/20 with the left.  Patient reports that she does not feel like her vision is blurry or any different than it is at baseline, other than some slight lateral blurring of her left eye whenever she looks completely to the left.  I consulted ophthalmology and spoke with Dr. Essie Hart.  I discussed the patient's presentation, physical exam and findings as well as the ocular pressures.  He reports to treat the shingles and he will see the patient tomorrow in clinic.  Patient her first dose of antivirals here.  Will start her on valacyclovir every 8 hours for the next 7 days.  I have placed information for Dr. Silvana Newness clinic which is clinic he will be out tomorrow to see her into the discharge paperwork. The patient is on a prednisone taper from her PCP and is on the second day,  encouraged her to keep taking this.   We discussed the results of the labs/imaging. The plan is take valcyclovir as prescribed, take pain medication as prescribed, follow up with ophthalmology tomorrow. We discussed strict return precautions and red flag symptoms. The patient verbalized their understanding and agrees to the plan. The patient is stable and being discharged home in good condition.  Portions of this report may have been transcribed using voice recognition software. Every effort was made to ensure accuracy; however, inadvertent computerized transcription errors may be present.   I discussed this case with my attending physician who cosigned this note including patient's presenting symptoms, physical exam, and planned diagnostics and interventions. Attending physician stated agreement with plan or made changes to plan which were implemented.   Attending physician assessed patient at bedside.  Final Clinical Impression(s) / ED Diagnoses Final diagnoses:  Herpes zoster without complication    Rx / DC Orders ED Discharge Orders          Ordered    valACYclovir (VALTREX) 1000 MG tablet  3 times daily        05/19/23 1325              Achille Rich, [place] 05/19/23 1350    Arby Barrette, MD 05/21/23 [phone]

## 2023-05-27 IMAGING — US US ABDOMEN LIMITED
2 series · 14 of 25 positions shown · non-contrast
Comparison: Multiple exams, including abdominal ultrasound
08/27/2017

CLINICAL DATA: Right upper quadrant abdominal pain with nausea and
vomiting. Positive [id number] test on 06/19/2021

EXAM:
ULTRASOUND ABDOMEN LIMITED RIGHT UPPER QUADRANT

[Series 1: us abdomen limited · 13 of 32 slices shown (1 of 2)]
[im 1/32]
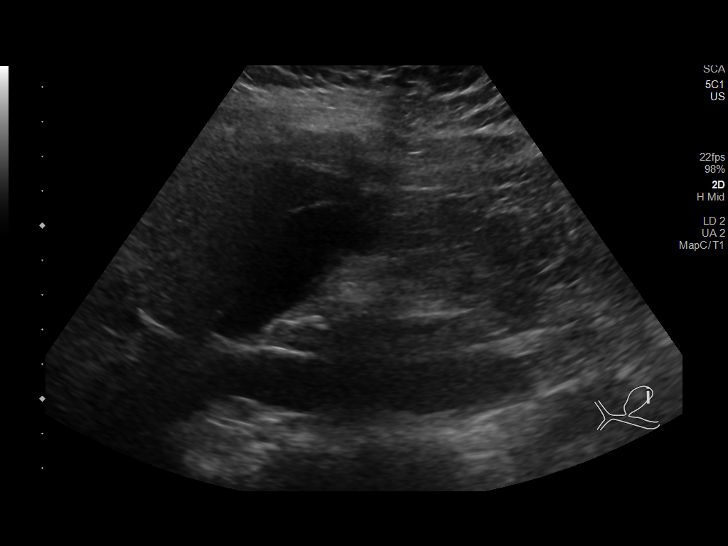
[im 3/32]
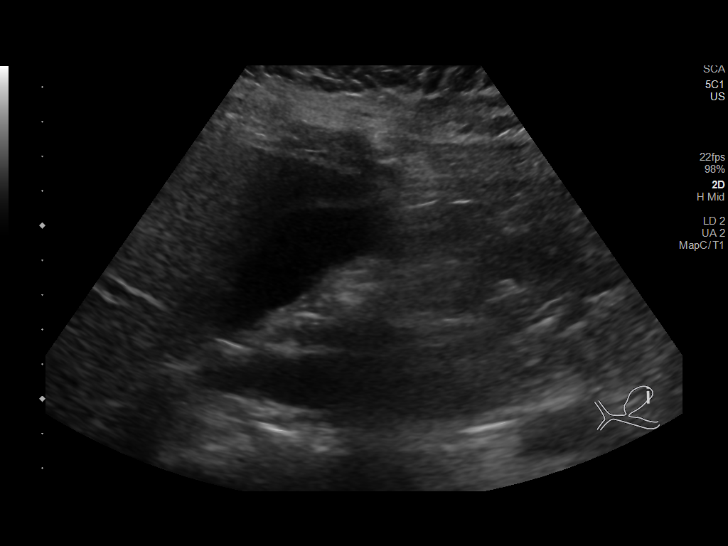
[im 6/32]
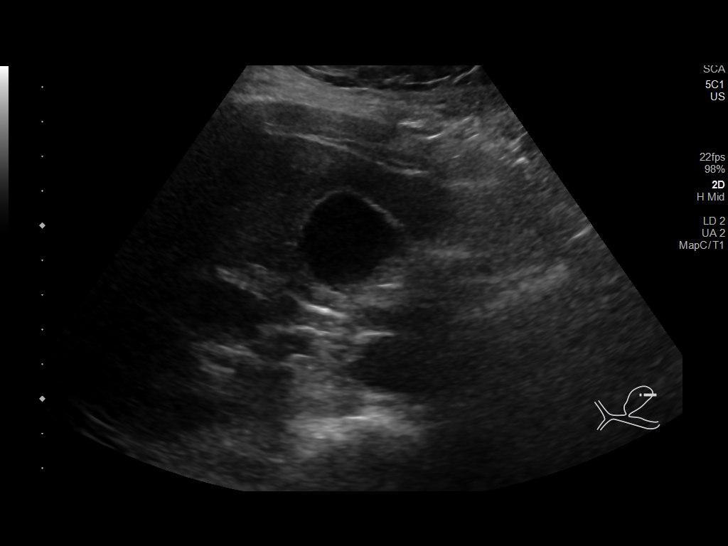
[im 9/32]
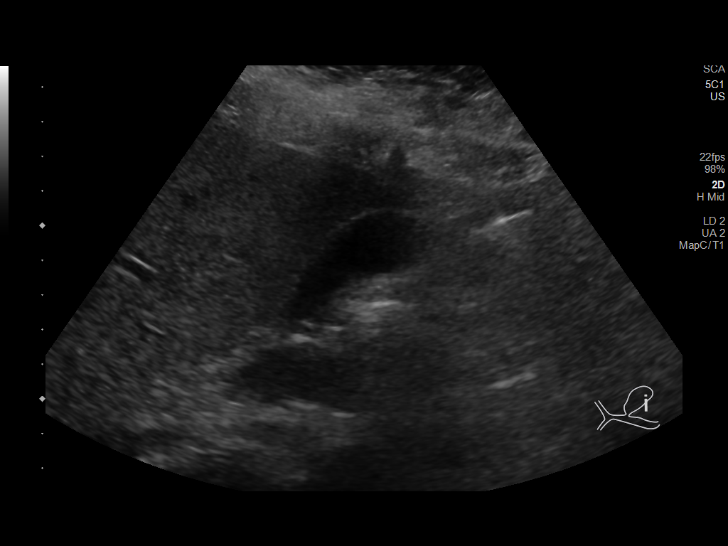
[im 11/32]
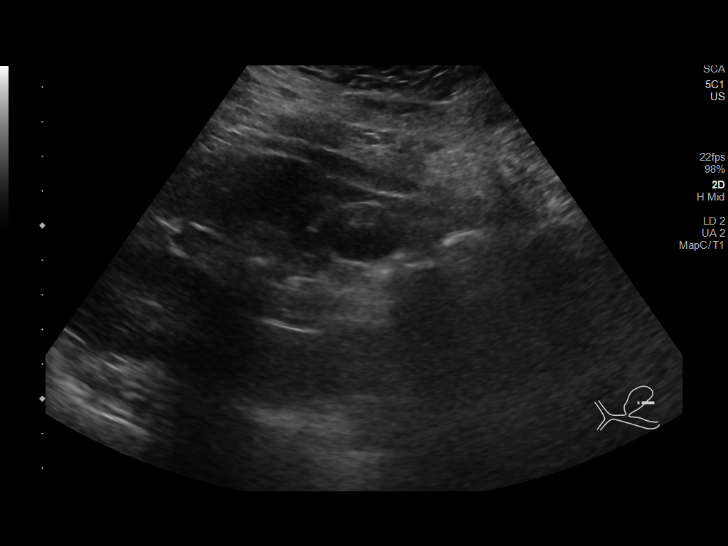
[im 13/32]
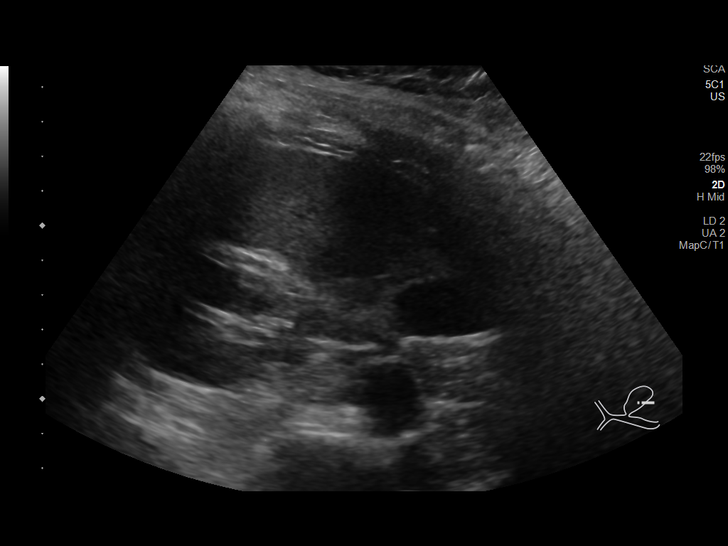
[im 15/32]
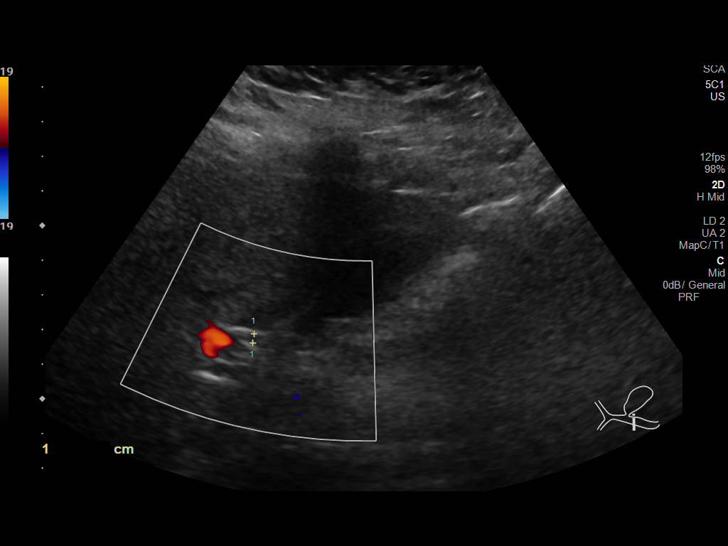
[im 18/32]
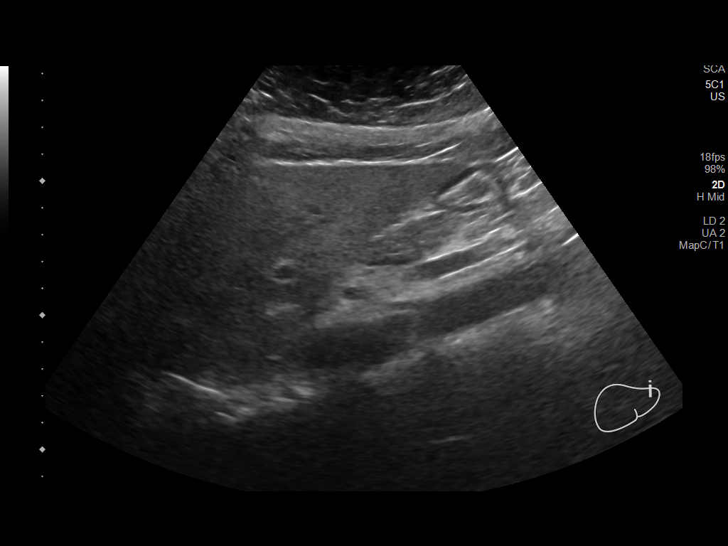
[im 21/32]
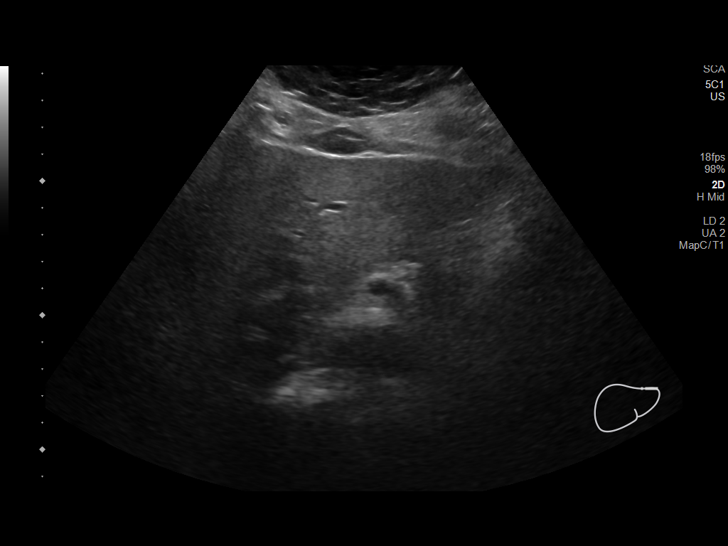
[im 22/32]
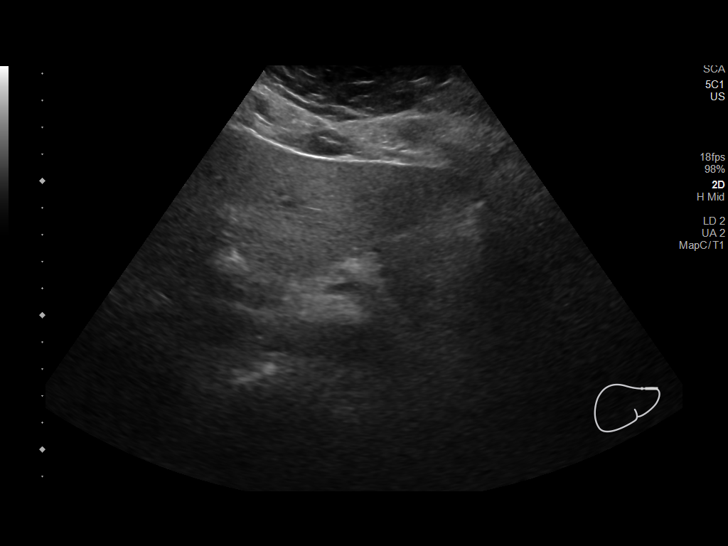
[im 25/32]
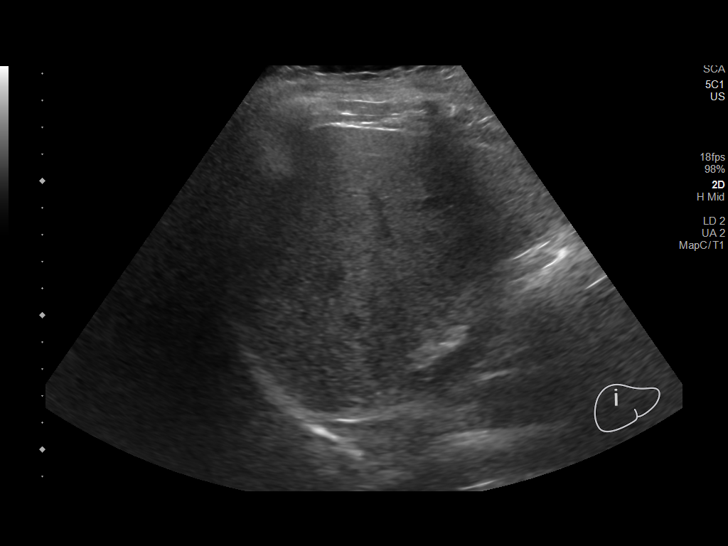
[im 27/32]
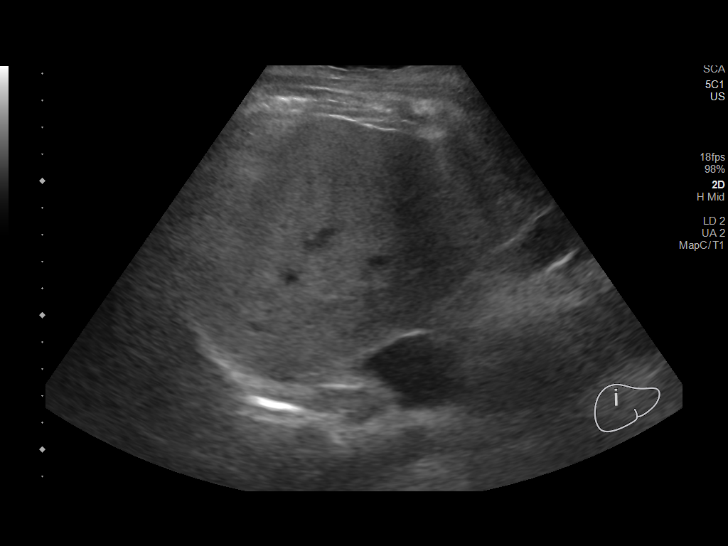
[im 30/32]
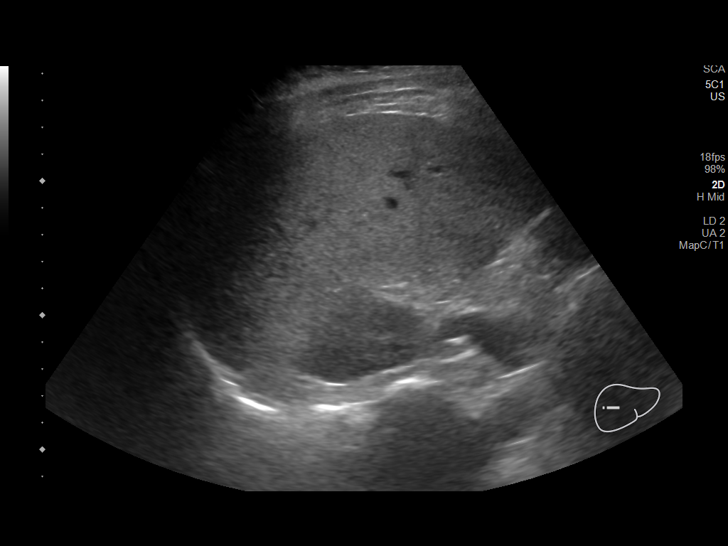

[Series 2: us abdomen limited · 1 of 1 slices shown (2 of 2)]
[im 1/1]
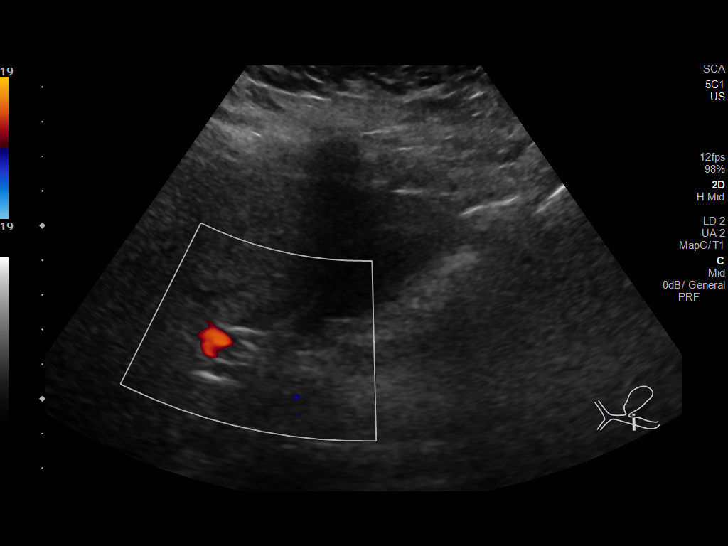

[14 of 25 positions shown; findings below may reference images not displayed]

FINDINGS: Gallbladder:

No gallstones or wall thickening visualized. No sonographic Murphy
sign noted by sonographer (although Murphy sign reportedly observed
on earlier physical exam).

Common bile duct:

Diameter: 0.3 cm

Liver:

No focal lesion identified. Mildly accentuated hepatic echogenicity
is nonspecific but could reflect hepatic steatosis. Portal vein is
patent on color Doppler imaging with normal direction of blood flow
towards the liver.

Other: None.
IMPRESSION: 1. Nonspecific mildly accentuated hepatic echogenicity, hepatic
steatosis is not excluded. Correlate with pending hepatic function
panel.
2. Sonographic Murphy sign was not observed during the exam,
although was reportedly observed on earlier physical exam.
3. No biliary dilatation or gallstones identified.

## 2023-07-01 IMAGING — DX DG CHEST 1V PORT
1 series · 1 of 1 positions shown · non-contrast
Comparison: 06/23/2021

CLINICAL DATA: Fever, cough, nausea for 3 days

EXAM:
PORTABLE CHEST 1 VIEW

[chest ap]
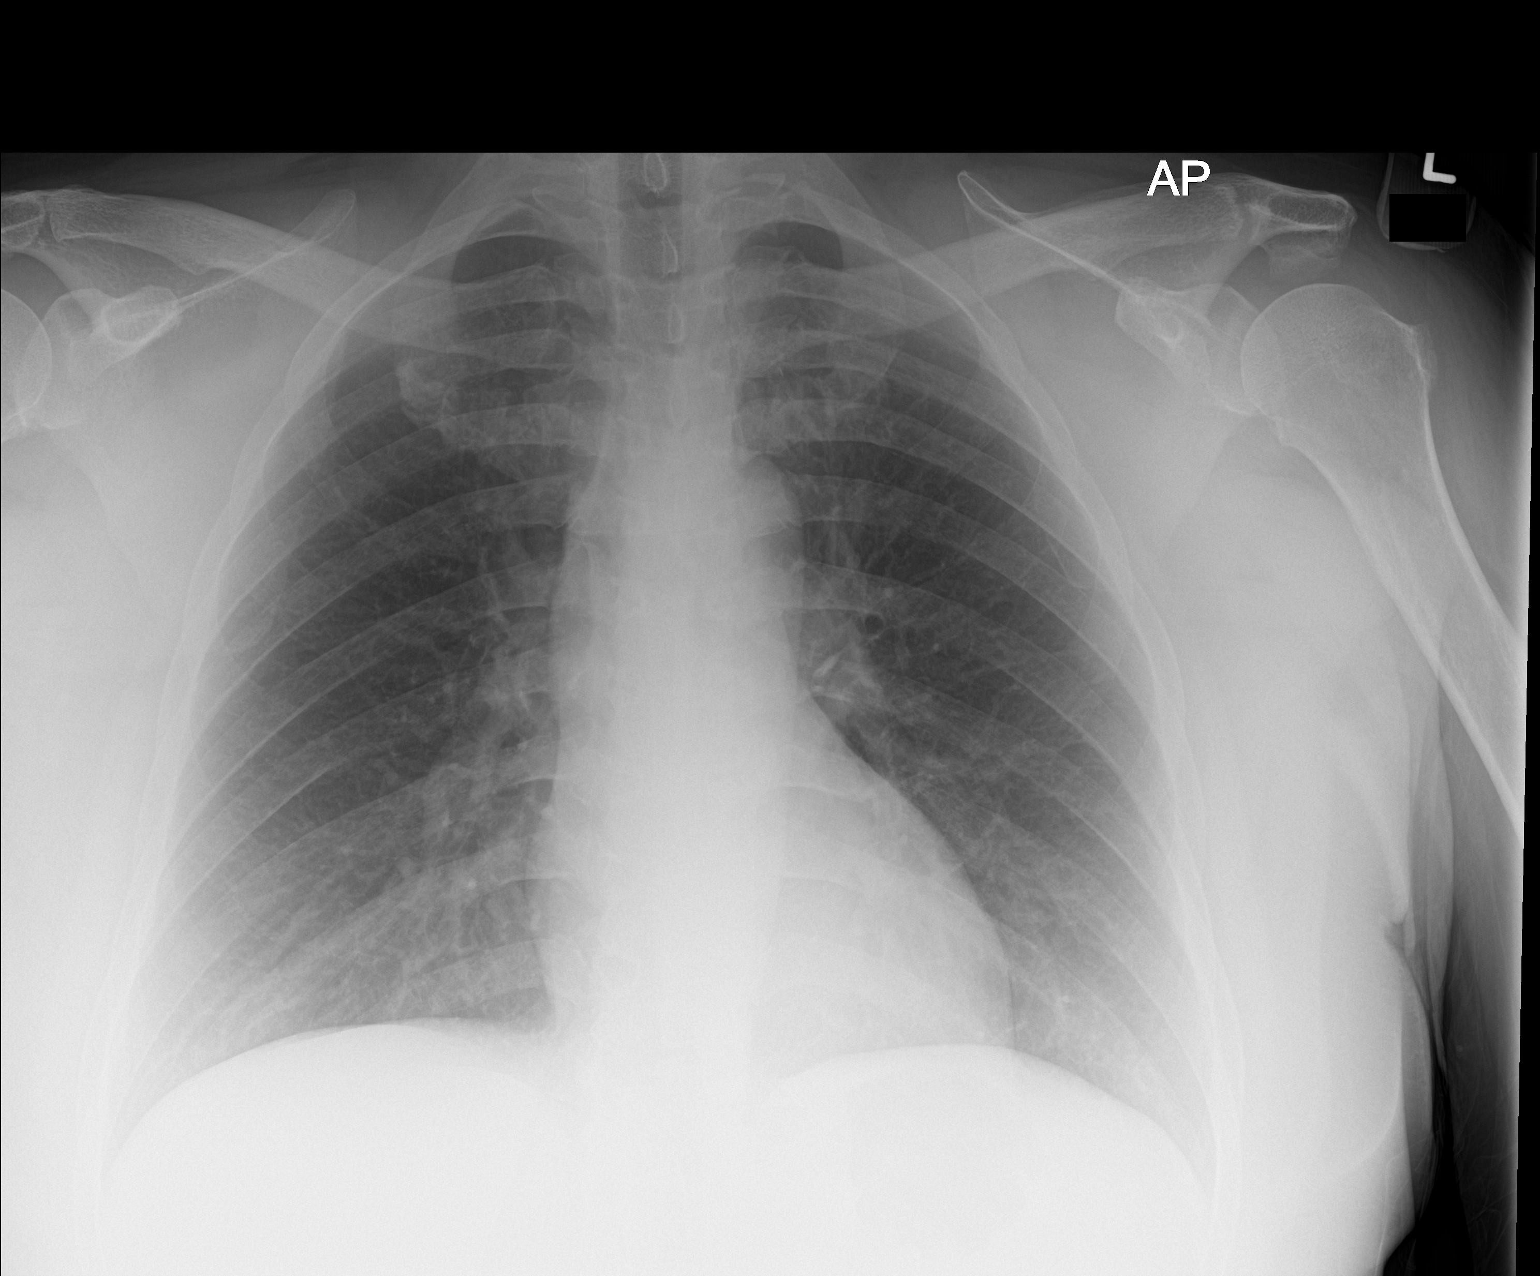

[1 of 1 positions shown; findings below may reference images not displayed]

FINDINGS: The heart size and mediastinal contours are within normal limits.
Both lungs are clear. The visualized skeletal structures are
unremarkable.
IMPRESSION: No active disease.

## 2023-08-05 ENCOUNTER — Encounter (HOSPITAL_BASED_OUTPATIENT_CLINIC_OR_DEPARTMENT_OTHER): Payer: Self-pay

## 2023-08-05 ENCOUNTER — Other Ambulatory Visit: Payer: Self-pay

## 2023-08-05 ENCOUNTER — Emergency Department (HOSPITAL_BASED_OUTPATIENT_CLINIC_OR_DEPARTMENT_OTHER)
Admission: EM | Admit: 2023-08-05 | Discharge: 2023-08-06 | Disposition: A | Discharge status: Home / Self Care | Payer: BC Managed Care – PPO | Attending: Emergency Medicine | Admitting: Emergency Medicine

## 2023-08-05 DIAGNOSIS — M79601 Pain in right arm: Secondary | ICD-10-CM | POA: Diagnosis present

## 2023-08-05 DIAGNOSIS — R079 Chest pain, unspecified: Secondary | ICD-10-CM

## 2023-08-05 DIAGNOSIS — M25511 Pain in right shoulder: Secondary | ICD-10-CM

## 2023-08-05 LAB — CBC WITH DIFFERENTIAL/PLATELET
Abs Immature Granulocytes: 0.05 10*3/uL (ref 0.00–0.07)
Basophils Absolute: 0 10*3/uL (ref 0.0–0.1)
Basophils Relative: 0 %
Eosinophils Absolute: 0.1 10*3/uL (ref 0.0–0.5)
Eosinophils Relative: 1 %
HCT: 38.4 % (ref 36.0–46.0)
Hemoglobin: 13.1 g/dL (ref 12.0–15.0)
Immature Granulocytes: 0 %
Lymphocytes Relative: 18 %
Lymphs Abs: 2.3 10*3/uL (ref 0.7–4.0)
MCH: 30 pg (ref 26.0–34.0)
MCHC: 34.1 g/dL (ref 30.0–36.0)
MCV: 88.1 fL (ref 80.0–100.0)
Monocytes Absolute: 1 10*3/uL (ref 0.1–1.0)
Monocytes Relative: 8 %
Neutro Abs: 9.2 10*3/uL — ABNORMAL HIGH (ref 1.7–7.7)
Neutrophils Relative %: 73 %
Platelets: 329 10*3/uL (ref 150–400)
RBC: 4.36 MIL/uL (ref 3.87–5.11)
RDW: 12.9 % (ref 11.5–15.5)
WBC: 12.7 10*3/uL — ABNORMAL HIGH (ref 4.0–10.5)
nRBC: 0 % (ref 0.0–0.2)

## 2023-08-05 LAB — BASIC METABOLIC PANEL
Anion gap: 9 (ref 5–15)
BUN: 11 mg/dL (ref 6–20)
CO2: 23 mmol/L (ref 22–32)
Calcium: 9 mg/dL (ref 8.9–10.3)
Chloride: 104 mmol/L (ref 98–111)
Creatinine, Ser: 0.65 mg/dL (ref 0.44–1.00)
GFR, Estimated: >60 mL/min (ref >60)
Glucose, Bld: 129 mg/dL — ABNORMAL HIGH (ref 70–99)
Potassium: 3.5 mmol/L (ref 3.5–5.1)
Sodium: 136 mmol/L (ref 135–145)

## 2023-08-05 LAB — TROPONIN I (HIGH SENSITIVITY): Troponin I (High Sensitivity): 3 ng/L (ref <18)

## 2023-08-05 MED ORDER — FENTANYL CITRATE PF 50 MCG/ML IJ SOSY
50.0000 ug | PREFILLED_SYRINGE | Freq: Once | INTRAMUSCULAR | Status: AC
  Administered 2023-08-05: 50 ug via INTRAVENOUS
  Filled 2023-08-05: qty 1

## 2023-08-05 NOTE — ED Provider Notes (Signed)
EMERG[facility]Y DEPARTMENT AT [facility] Provider Note   CSN: [id number] Arrival date & time: 08/05/23  2205     History  Chief Complaint  Patient presents with   Arm Pain    Stacey Weeks is a 47 y.o. female.  Patient to ED for evaluation of pain in her right arm starting at the right upper chest and extending down medial arm to the hand. No known injury. No swelling. No neck pain. She reports feeling a tingling sensation in all fingers of the right hand. Symptoms started this morning and worsened throughout the day while working. She has taken ibuprofen, Tylenol and Flexeril without relief. No history of similar symptoms. No weakness of the extremity. She is right hand dominant.  The history is provided by the patient. No language interpreter was used.  Arm Pain       Home Medications Prior to Admission medications   Medication Sig Start Date End Date Taking? Authorizing Provider  diphenhydrAMINE (BENADRYL) 25 MG tablet Take 1 tablet (25 mg total) by mouth every 6 (six) hours as needed. 05/19/23   Achille Rich, PA-C  famotidine (PEPCID) 20 MG tablet Take 1 tablet (20 mg total) by mouth 2 (two) times daily. 06/28/21 07/28/21  Terald Sleeper, MD  fluticasone (FLONASE) 50 MCG/ACT nasal spray Place 2 sprays into both nostrils daily. 12/02/20   Henderly, Britni A, PA-C  fluticasone (FLONASE) 50 MCG/ACT nasal spray Place 2 sprays into both nostrils daily for 3 days. 06/28/21 07/01/21  Terald Sleeper, MD  lidocaine (HM LIDOCAINE PATCH) 4 % Place 2 patches onto the skin daily. 11/05/22   Small, Brooke L, PA  meclizine (ANTIVERT) 25 MG tablet Take 1 tablet (25 mg total) by mouth 3 (three) times daily as needed for dizziness. 09/15/18   Liberty Handy, PA-C  methylPREDNISolone (MEDROL DOSEPAK) 4 MG TBPK tablet Take per package instructions 05/02/22   Roemhildt, Lorin T, PA-C  ondansetron (ZOFRAN ODT) 4 MG disintegrating tablet Take 1 tablet (4 mg total) by mouth  every 8 (eight) hours as needed for nausea or vomiting. 08/02/21   Achille Rich, PA-C  oxyCODONE (ROXICODONE) 5 MG immediate release tablet Take 1 tablet (5 mg total) by mouth every 4 (four) hours as needed for severe pain. 05/19/23   Achille Rich, PA-C  potassium chloride (K-DUR) 10 MEQ tablet Take 4 tablets (40 mEq total) by mouth daily for 7 days. 09/15/18 09/22/18  Liberty Handy, PA-C  potassium chloride SA (K-DUR,KLOR-CON) 20 MEQ tablet Take 1 tablet (20 mEq total) by mouth daily. 02/05/18   Molpus, John, MD  predniSONE (STERAPRED UNI-PAK 21 TAB) 10 MG (21) TBPK tablet Take by mouth daily. Take 6 tabs by mouth daily  for 2 days, then 5 tabs for 2 days, then 4 tabs for 2 days, then 3 tabs for 2 days, 2 tabs for 2 days, then 1 tab by mouth daily for 2 days 04/21/23   Glyn Ade, MD  valACYclovir (VALTREX) 1000 MG tablet Take 1 tablet (1,000 mg total) by mouth 3 (three) times daily. 05/19/23   Achille Rich, PA-C      Allergies    Penicillin g and Percocet [oxycodone-acetaminophen]    Review of Systems   Review of Systems  Physical Exam Updated Vital Signs BP (!) 145/95 (BP Location: Left Arm)   Pulse (!) 107   Temp 99 F (37.2 C)   Resp 18   Ht 5\' 7"  (1.702 m)   Wt 90.7 kg  LMP 12/14/2010   SpO2 95%   BMI 31.32 kg/m  Physical Exam Vitals and nursing note reviewed.  Constitutional:      Appearance: Normal appearance.  Pulmonary:     Effort: Pulmonary effort is normal.  Chest:     Chest wall: No tenderness.  Musculoskeletal:        General: No swelling. Normal range of motion.     Comments: Tenderness to supraclavicular right upper chest without swelling or redness. Tenderness continues along the medial aspect of the arm. No axillary tenderness. There is no swelling or redness of the extremity. Distal pulses are present. Grip strength of the right hand is full. No joint swelling. No midline or paracervical tenderness. No right lateral neck tenderness.   Skin:    General:  Skin is warm and dry.     Findings: No erythema.  Neurological:     Mental Status: She is alert and oriented to person, place, and time.     Sensory: No sensory deficit.     ED Results / Procedures / Treatments   Labs (all labs ordered are listed, but only abnormal results are displayed) Labs Reviewed  CBC WITH DIFFERENTIAL/PLATELET - Abnormal; Notable for the following components:      Result Value   WBC 12.7 (*)    Neutro Abs 9.2 (*)    All other components within normal limits  BASIC METABOLIC PANEL - Abnormal; Notable for the following components:   Glucose, Bld 129 (*)    All other components within normal limits  TROPONIN I (HIGH SENSITIVITY)    EKG EKG Interpretation Date/Time:  Tuesday August 05 2023 23:04:18 EDT Ventricular Rate:  94 PR Interval:  147 QRS Duration:  82 QT Interval:  345 QTC Calculation: 432 R Axis:   84  Text Interpretation: Sinus rhythm Confirmed by Kennis Carina [phone]) on 08/05/2023 11:17:15 PM  Radiology No results found.  Procedures Procedures    Medications Ordered in ED Medications  fentaNYL (SUBLIMAZE) injection 50 mcg (50 mcg Intravenous Given 08/05/23 2324)    ED Course/ Medical Decision Making/ A&P Clinical Course as of 08/05/23 2355  Tue Aug 05, 2023  2315 Patient to ED with arm pain as per HPI. EKG abnormal with nonspecific t-wave inversions. No tachycardia on EKG, no pleuritic pain - doubt PE. Doubt infection or vascular compromise in the extremity. No evidence cervical radiculopathy. Will get labs, treat pain and re-evaluate. Patient updated on plan of care and agreeable.  [SU]  2355 Patient care signed out to Dr. Pilar Plate pending troponin, patient re-evaluation.  [SU]    Clinical Course User Index [SU] Elpidio Anis, PA-C                                 Medical Decision Making Amount and/or Complexity of Data Reviewed Labs: ordered.  Risk Prescription drug management.           Final Clinical  Impression(s) / ED Diagnoses Final diagnoses:  Right arm pain    Rx / DC Orders ED Discharge Orders     None         Elpidio Anis, PA-C 08/05/23 2355    Glyn Ade, MD 08/06/23 1500

## 2023-08-05 NOTE — ED Triage Notes (Signed)
Arm pain upon waking up this morning, pain worsening progressively. "My heart feels like it's fluttering fast and my fingers keep going numb every now and then." Denies injury or heavy lifting.

## 2023-08-06 MED ORDER — METHOCARBAMOL 500 MG PO TABS: 500.0000 mg | ORAL_TABLET | Freq: Three times a day (TID) | ORAL | 0 refills | Status: AC | PRN

## 2023-08-06 MED ORDER — NAPROXEN 500 MG PO TABS: 500.0000 mg | ORAL_TABLET | Freq: Two times a day (BID) | ORAL | 0 refills | Status: AC

## 2023-08-06 NOTE — Discharge Instructions (Signed)
You were evaluated in the Emergency Department and after careful evaluation, we did not find any emergent condition requiring admission or further testing in the hospital.  Your exam/testing today is overall reassuring.  Symptoms likely due muscular strain or spasm.  Recommend using the Naprosyn twice daily as prescribed for pain.  Can use the Robaxin muscle relaxer for more significant pain, best used at night if you are having trouble sleeping as it can cause drowsiness.  Recommend resting your right arm for the next few days and following up with your primary care doctor and/or an orthopedic specialist.  Please return to the Emergency Department if you experience any worsening of your condition.   Thank you for allowing [place] to be a part of your care.

## 2023-08-06 NOTE — ED Provider Notes (Signed)
  Provider Note MRN:  [id number]  Arrival date & time: 08/06/23    ED Course and Medical Decision Making  Assumed care from PA Upstill at shift change.  Right upper chest pain, seems to be reproducible with movement of the right shoulder.  Nonspecific EKG findings, mild tachycardia on arrival, obtaining screening labs, troponin.  12:15 AM update: On my exam I agree that the etiology is almost certainly MSK in etiology.  Pain elicited with any significant range of motion of the shoulder, however range of motion is intact, there is no erythema or increased warmth, nothing to suggest septic joint.  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  Suspect tendinitis with some pain radiating into the upper chest musculature.  Remainder of workup reassuring, troponin negative.  Appropriate for discharge.  Procedures  Final Clinical Impressions(s) / ED Diagnoses     ICD-10-CM   1. Chest pain, unspecified type  R07.9     2. Acute pain of right shoulder  M25.511       ED Discharge Orders          Ordered    naproxen (NAPROSYN) 500 MG tablet  2 times daily        08/06/23 0015    methocarbamol (ROBAXIN) 500 MG tablet  Every 8 hours PRN        08/06/23 0015              Discharge Instructions      You were evaluated in the Emergency Department and after careful evaluation, we did not find any emergent condition requiring admission or further testing in the hospital.  Your exam/testing today is overall reassuring.  Symptoms likely due muscular strain or spasm.  Recommend using the Naprosyn twice daily as prescribed for pain.  Can use the Robaxin muscle relaxer for more significant pain, best used at night if you are having trouble sleeping as it can cause drowsiness.  Recommend resting your right arm for the next few days and following up with your primary care doctor and/or an orthopedic specialist.  Please  return to the Emergency Department if you experience any worsening of your condition.   Thank you for allowing [place] to be a part of your care.    Elmer Sow. Pilar Plate, MD [facility] Emergency Medicine [facility] [email or web address]    Sabas Sous, MD 08/06/23 [phone]

## 2024-07-07 ENCOUNTER — Other Ambulatory Visit: Payer: Self-pay

## 2024-07-07 ENCOUNTER — Emergency Department (HOSPITAL_BASED_OUTPATIENT_CLINIC_OR_DEPARTMENT_OTHER)

## 2024-07-07 ENCOUNTER — Emergency Department (HOSPITAL_BASED_OUTPATIENT_CLINIC_OR_DEPARTMENT_OTHER)
Admission: EM | Admit: 2024-07-07 | Discharge: 2024-07-07 | Disposition: A | Discharge status: Home / Self Care | Attending: Emergency Medicine | Admitting: Emergency Medicine

## 2024-07-07 ENCOUNTER — Encounter (HOSPITAL_BASED_OUTPATIENT_CLINIC_OR_DEPARTMENT_OTHER): Payer: Self-pay

## 2024-07-07 DIAGNOSIS — R0602 Shortness of breath: Secondary | ICD-10-CM | POA: Diagnosis present

## 2024-07-07 DIAGNOSIS — R519 Headache, unspecified: Secondary | ICD-10-CM | POA: Insufficient documentation

## 2024-07-07 MED ORDER — KETOROLAC TROMETHAMINE 15 MG/ML IJ SOLN
15.0000 mg | Freq: Once | INTRAMUSCULAR | Status: AC
  Administered 2024-07-07: 15 mg via INTRAMUSCULAR
  Filled 2024-07-07: qty 1

## 2024-07-07 MED ORDER — LIDOCAINE VISCOUS HCL 2 % MT SOLN
15.0000 mL | Freq: Once | OROMUCOSAL | Status: AC
  Administered 2024-07-07: 15 mL via OROMUCOSAL
  Filled 2024-07-07: qty 15

## 2024-07-07 NOTE — ED Triage Notes (Signed)
 Patient reports she was asleep and suddenly woke up like something was blocking her airway.  Continues to complain of sob but not chest pain.  Reports she still feels like her heart is racing but having a severe headache.

## 2024-07-07 NOTE — ED Provider Notes (Signed)
  EMERGENCY DEPARTMENT AT [facility] Provider Note   CSN: [id number] Arrival date & time: 07/07/24  9165     Patient presents with: Shortness of Breath and Headache   Stacey Weeks is a 48 y.o. female.   48 yo F with a chief complaints of a sudden sensation like she had something stuck in her throat.  She tells me that she woke up this morning and suddenly felt that way.  Like her mouth is really dry and was kind of panicked and had some trouble catching her breath.  She feels a bit better now but still feels like something is stuck in her throat.  She had nothing with bones or anything that she thought got stuck at dinner last night.  She denies cough congestion or fever.     Shortness of Breath Associated symptoms: headaches   Headache      Prior to Admission medications   Medication Sig Start Date End Date Taking? Authorizing Provider  diphenhydrAMINE  (BENADRYL ) 25 MG tablet Take 1 tablet (25 mg total) by mouth every 6 (six) hours as needed. 05/19/23   Bernis Ernst, PA-C  famotidine  (PEPCID ) 20 MG tablet Take 1 tablet (20 mg total) by mouth 2 (two) times daily. 06/28/21 07/28/21  Cottie Donnice PARAS, MD  fluticasone  (FLONASE ) 50 MCG/ACT nasal spray Place 2 sprays into both nostrils daily. 12/02/20   Henderly, Britni A, PA-C  fluticasone  (FLONASE ) 50 MCG/ACT nasal spray Place 2 sprays into both nostrils daily for 3 days. 06/28/21 07/01/21  Cottie Donnice PARAS, MD  lidocaine  (HM LIDOCAINE  PATCH) 4 % Place 2 patches onto the skin daily. 11/05/22   Small, Brooke L, PA  meclizine  (ANTIVERT ) 25 MG tablet Take 1 tablet (25 mg total) by mouth 3 (three) times daily as needed for dizziness. 09/15/18   Norris Will PARAS, PA-C  methocarbamol  (ROBAXIN ) 500 MG tablet Take 1 tablet (500 mg total) by mouth every 8 (eight) hours as needed for muscle spasms. 08/06/23   Theadore Ozell HERO, MD  methylPREDNISolone  (MEDROL  DOSEPAK) 4 MG TBPK tablet Take per package instructions 05/02/22    Roemhildt, Lorin T, PA-C  naproxen  (NAPROSYN ) 500 MG tablet Take 1 tablet (500 mg total) by mouth 2 (two) times daily. 08/06/23   Theadore Ozell HERO, MD  ondansetron  (ZOFRAN  ODT) 4 MG disintegrating tablet Take 1 tablet (4 mg total) by mouth every 8 (eight) hours as needed for nausea or vomiting. 08/02/21   Bernis Ernst, PA-C  oxyCODONE  (ROXICODONE ) 5 MG immediate release tablet Take 1 tablet (5 mg total) by mouth every 4 (four) hours as needed for severe pain. 05/19/23   Bernis Ernst, PA-C  potassium chloride  (K-DUR) 10 MEQ tablet Take 4 tablets (40 mEq total) by mouth daily for 7 days. 09/15/18 09/22/18  Norris Will PARAS, PA-C  potassium chloride  SA (K-DUR,KLOR-CON ) 20 MEQ tablet Take 1 tablet (20 mEq total) by mouth daily. 02/05/18   Molpus, John, MD  predniSONE  (STERAPRED UNI-PAK 21 TAB) 10 MG (21) TBPK tablet Take by mouth daily. Take 6 tabs by mouth daily  for 2 days, then 5 tabs for 2 days, then 4 tabs for 2 days, then 3 tabs for 2 days, 2 tabs for 2 days, then 1 tab by mouth daily for 2 days 04/21/23   Jerral Meth, MD  valACYclovir  (VALTREX ) 1000 MG tablet Take 1 tablet (1,000 mg total) by mouth 3 (three) times daily. 05/19/23   Bernis Ernst, PA-C    Allergies: Oxycodone -acetaminophen , Penicillin g, and  Percocet [oxycodone -acetaminophen ]    Review of Systems  Respiratory:  Positive for shortness of breath.   Neurological:  Positive for headaches.    Updated Vital Signs BP (!) 166/94   Pulse 86   Temp 97.6 F (36.4 C) (Oral)   Resp 17   Ht 5' 7 (1.702 m)   Wt 90.7 kg   LMP 12/14/2010   SpO2 100%   BMI 31.32 kg/m   Physical Exam Vitals and nursing note reviewed.  Constitutional:      General: She is not in acute distress.    Appearance: She is well-developed. She is not diaphoretic.  HENT:     Head: Normocephalic and atraumatic.     Comments: Swollen turbinates.  Hard to visualize the posterior oropharynx due to the tongue size.  TMs normal bilaterally.  No sublingual  swelling. Eyes:     Pupils: Pupils are equal, round, and reactive to light.  Cardiovascular:     Rate and Rhythm: Normal rate and regular rhythm.     Heart sounds: No murmur heard.    No friction rub. No gallop.  Pulmonary:     Effort: Pulmonary effort is normal.     Breath sounds: No wheezing or rales.  Abdominal:     General: There is no distension.     Palpations: Abdomen is soft.     Tenderness: There is no abdominal tenderness.  Musculoskeletal:        General: No tenderness.     Cervical back: Normal range of motion and neck supple.  Skin:    General: Skin is warm and dry.  Neurological:     Mental Status: She is alert and oriented to person, place, and time.  Psychiatric:        Behavior: Behavior normal.     (all labs ordered are listed, but only abnormal results are displayed) Labs Reviewed - No data to display  EKG: EKG Interpretation Date/Time:  Wednesday July 07 2024 08:44:11 EDT Ventricular Rate:  82 PR Interval:  137 QRS Duration:  84 QT Interval:  380 QTC Calculation: 444 R Axis:   51  Text Interpretation: Sinus rhythm Probable left atrial enlargement downsloping st segment in lead III seen on prior No significant change since last tracing Confirmed by Emil Share [phone]) on 07/07/2024 8:47:11 AM  Radiology: DG Neck Soft Tissue Result Date: 07/07/2024 EXAM: 2 VIEW(S) XRAY OF THE SOFT TISSUE NECK 07/07/2024 09:48:00 AM COMPARISON: None available. CLINICAL HISTORY: Patient reports she was asleep and suddenly woke up like something was blocking her airway. Continues to complain of SOB but not chest pain. Reports she still feels like her heart is racing but having a severe headache. FINDINGS: SOFT TISSUES: There are around an ovoid calcifications present within the neck which overlie the upper airway on the lateral radiograph, none is readily apparent overlying the airway on the frontal view. There is no soft tissue swelling. EPIGLOTTIS: The epiglottis appears  normal. BONES: No acute osseous abnormality. IMPRESSION: 1. No acute findings. 2. Ovoid calcifications overlying the upper airway on the lateral view, not seen on the frontal view. Electronically signed by: Evalene Coho MD 07/07/2024 09:55 AM EDT RP Workstation: [id number]   DG Chest 2 View Result Date: 07/07/2024 EXAM: 2 VIEW(S) XRAY OF THE CHEST 07/07/2024 09:48:00 AM COMPARISON: PA and lateral radiographs of the chest dated 11/05/2022. CLINICAL HISTORY: Patient reports she was asleep and suddenly woke up like something was blocking her airway. Continues to complain of SOB but not chest  pain. Reports she still feels like her heart is racing but having a severe headache. FINDINGS: LUNGS AND PLEURA: No focal pulmonary opacity. No pulmonary edema. No pleural effusion. No pneumothorax. HEART AND MEDIASTINUM: No acute abnormality of the cardiac and mediastinal silhouettes. BONES AND SOFT TISSUES: No acute osseous abnormality. IMPRESSION: 1. No acute process. Electronically signed by: Evalene Coho MD 07/07/2024 09:51 AM EDT RP Workstation: [id number]     Procedures   Medications Ordered in the ED  lidocaine  (XYLOCAINE ) 2 % viscous mouth solution 15 mL (15 mLs Mouth/Throat Given 07/07/24 0911)  ketorolac  (TORADOL ) 15 MG/ML injection 15 mg (15 mg Intramuscular Given 07/07/24 9078)                                    Medical Decision Making Amount and/or Complexity of Data Reviewed Radiology: ordered.  Risk Prescription drug management.   48 yo F with a complaints of feeling like something is in her throat.  She woke up feeling this way this morning.  Felt panicked and had trouble breathing.  Sensation has improved but still feels like something stuck in there.  Feels like maybe it is cotton.  Has been able to swallow.  She denies any obvious choking events.  Nothing that she had trouble swallowing last night during dinner.  By history it was could be sleep apnea.  She also has some signs  consistent with an upper respiratory illness.  Will obtain a chest x-ray.  Plain film throat.  Chest x-ray independently interpreted by me without focal infiltrate or pneumothorax.  Plain film of the soft tissue of the neck without obvious foreign body or signs of epiglottitis or RPA.  I discussed results with patient.  Continues to improve.  Tolerating by mouth.  Will discharge home.  PCP follow-up.  10:07 AM:  I have discussed the diagnosis/risks/treatment options with the patient.  Evaluation and diagnostic testing in the emergency department does not suggest an emergent condition requiring admission or immediate intervention beyond what has been performed at this time.  They will follow up with PCP. We also discussed returning to the ED immediately if new or worsening sx occur. We discussed the sx which are most concerning (e.g., sudden worsening pain, fever, inability to tolerate by mouth) that necessitate immediate return. Medications administered to the patient during their visit and any new prescriptions provided to the patient are listed below.  Medications given during this visit Medications  lidocaine  (XYLOCAINE ) 2 % viscous mouth solution 15 mL (15 mLs Mouth/Throat Given 07/07/24 0911)  ketorolac  (TORADOL ) 15 MG/ML injection 15 mg (15 mg Intramuscular Given 07/07/24 9078)     The patient appears reasonably screen and/or stabilized for discharge and I doubt any other medical condition or other [facility] requiring further screening, evaluation, or treatment in the ED at this time prior to discharge.       Final diagnoses:  Shortness of breath    ED Discharge Orders     None          Emil Share, DO 07/07/24 1007

## 2024-07-07 NOTE — Discharge Instructions (Signed)
 Please call your family doctor today and discuss your visit.  Please return for worsening difficulty breathing or inability to swallow.

## 2024-08-27 ENCOUNTER — Other Ambulatory Visit: Payer: Self-pay

## 2024-08-27 ENCOUNTER — Encounter (HOSPITAL_BASED_OUTPATIENT_CLINIC_OR_DEPARTMENT_OTHER): Payer: Self-pay

## 2024-08-27 ENCOUNTER — Emergency Department (HOSPITAL_BASED_OUTPATIENT_CLINIC_OR_DEPARTMENT_OTHER)
Admission: EM | Admit: 2024-08-27 | Discharge: 2024-08-27 | Disposition: A | Discharge status: Home / Self Care | Attending: Emergency Medicine | Admitting: Emergency Medicine

## 2024-08-27 DIAGNOSIS — R21 Rash and other nonspecific skin eruption: Secondary | ICD-10-CM | POA: Insufficient documentation

## 2024-08-27 DIAGNOSIS — R6884 Jaw pain: Secondary | ICD-10-CM | POA: Diagnosis not present

## 2024-08-27 MED ORDER — DEXAMETHASONE 4 MG PO TABS
10.0000 mg | ORAL_TABLET | Freq: Once | ORAL | Status: AC
Start: 2024-08-27 — End: 2024-08-27
  Administered 2024-08-27: 10 mg via ORAL
  Filled 2024-08-27: qty 3

## 2024-08-27 NOTE — ED Provider Notes (Signed)
 [place] EMERGENCY DEPARTMENT AT [facility] Provider Note   CSN: [id number] Arrival date & time: 08/27/24  1910     Patient presents with: Rash and Jaw Pain   Stacey Weeks is a 48 y.o. female.   48 yo F with a chief complaints of pain to the chest at the angle of the jaw and an itchy rash to the right side of her chest.  Noticed today.  Just had an EGD done yesterday.  She thinks maybe she had an allergy to something that was used during the procedure.  She denies difficulty breathing denies cough congestion or fever.   Rash      Prior to Admission medications   Medication Sig Start Date End Date Taking? Authorizing Provider  diphenhydrAMINE  (BENADRYL ) 25 MG tablet Take 1 tablet (25 mg total) by mouth every 6 (six) hours as needed. 05/19/23   Bernis Ernst, PA-C  famotidine  (PEPCID ) 20 MG tablet Take 1 tablet (20 mg total) by mouth 2 (two) times daily. 06/28/21 07/28/21  Cottie Donnice PARAS, MD  fluticasone  (FLONASE ) 50 MCG/ACT nasal spray Place 2 sprays into both nostrils daily. 12/02/20   Henderly, Britni A, PA-C  fluticasone  (FLONASE ) 50 MCG/ACT nasal spray Place 2 sprays into both nostrils daily for 3 days. 06/28/21 07/01/21  Cottie Donnice PARAS, MD  lidocaine  (HM LIDOCAINE  PATCH) 4 % Place 2 patches onto the skin daily. 11/05/22   Small, Brooke L, PA  meclizine  (ANTIVERT ) 25 MG tablet Take 1 tablet (25 mg total) by mouth 3 (three) times daily as needed for dizziness. 09/15/18   Norris Will PARAS, PA-C  methocarbamol  (ROBAXIN ) 500 MG tablet Take 1 tablet (500 mg total) by mouth every 8 (eight) hours as needed for muscle spasms. 08/06/23   Theadore Ozell HERO, MD  methylPREDNISolone  (MEDROL  DOSEPAK) 4 MG TBPK tablet Take per package instructions 05/02/22   Roemhildt, Lorin T, PA-C  naproxen  (NAPROSYN ) 500 MG tablet Take 1 tablet (500 mg total) by mouth 2 (two) times daily. 08/06/23   Theadore Ozell HERO, MD  ondansetron  (ZOFRAN  ODT) 4 MG disintegrating tablet Take 1 tablet (4 mg  total) by mouth every 8 (eight) hours as needed for nausea or vomiting. 08/02/21   Bernis Ernst, PA-C  oxyCODONE  (ROXICODONE ) 5 MG immediate release tablet Take 1 tablet (5 mg total) by mouth every 4 (four) hours as needed for severe pain. 05/19/23   Bernis Ernst, PA-C  potassium chloride  (K-DUR) 10 MEQ tablet Take 4 tablets (40 mEq total) by mouth daily for 7 days. 09/15/18 09/22/18  Norris Will PARAS, PA-C  potassium chloride  SA (K-DUR,KLOR-CON ) 20 MEQ tablet Take 1 tablet (20 mEq total) by mouth daily. 02/05/18   Molpus, John, MD  predniSONE  (STERAPRED UNI-PAK 21 TAB) 10 MG (21) TBPK tablet Take by mouth daily. Take 6 tabs by mouth daily  for 2 days, then 5 tabs for 2 days, then 4 tabs for 2 days, then 3 tabs for 2 days, 2 tabs for 2 days, then 1 tab by mouth daily for 2 days 04/21/23   Jerral Meth, MD  valACYclovir  (VALTREX ) 1000 MG tablet Take 1 tablet (1,000 mg total) by mouth 3 (three) times daily. 05/19/23   Bernis Ernst, PA-C    Allergies: Oxycodone -acetaminophen , Penicillin g, and Percocet [oxycodone -acetaminophen ]    Review of Systems  Skin:  Positive for rash.    Updated Vital Signs BP (!) 160/86 (BP Location: Left Arm)   Pulse 84   Temp 98.2 F (36.8 C)  Resp (!) 22   Ht 5' 7 (1.702 m)   Wt 117.9 kg   LMP 12/14/2010   SpO2 100%   BMI 40.72 kg/m   Physical Exam Vitals and nursing note reviewed.  Constitutional:      General: She is not in acute distress.    Appearance: She is well-developed. She is not diaphoretic.  HENT:     Head: Normocephalic and atraumatic.  Eyes:     Pupils: Pupils are equal, round, and reactive to light.  Cardiovascular:     Rate and Rhythm: Normal rate and regular rhythm.     Heart sounds: No murmur heard.    No friction rub. No gallop.  Pulmonary:     Effort: Pulmonary effort is normal.     Breath sounds: No wheezing or rales.  Abdominal:     General: There is no distension.     Palpations: Abdomen is soft.     Tenderness: There is  no abdominal tenderness.  Musculoskeletal:        General: No tenderness.     Cervical back: Normal range of motion and neck supple.     Comments: Tenderness about the sternum, erythematous papules with some mild excoriations.  Tenderness at the angle of the jaw bilaterally without any obvious lymphadenopathy.  Tolerating secretions without difficulty.  No sublingual swelling.  Skin:    General: Skin is warm and dry.  Neurological:     Mental Status: She is alert and oriented to person, place, and time.  Psychiatric:        Behavior: Behavior normal.     (all labs ordered are listed, but only abnormal results are displayed) Labs Reviewed - No data to display  EKG: None  Radiology: No results found.   Procedures   Medications Ordered in the ED  dexamethasone  (DECADRON ) tablet 10 mg (10 mg Oral Given 08/27/24 2228)                                    Medical Decision Making Risk Prescription drug management.   48 yo F with a chief complaints of a rash to the right side of her chest pain about the sternum and pain at the angles of the jaw bilaterally.  Pain reproduced with palpation of these areas.  I wonder if the patient had too much sedation and required sternal rub and/or jaw thrust as that would correspond with the areas of her discomfort.  She maybe has a contact allergy to her right chest wall.  She has no signs of anaphylaxis or systemic exposure to an allergen.  Will give a single dose of Decadron .  Have her treat her other symptoms supportively.  PCP follow-up.  10:29 PM:  I have discussed the diagnosis/risks/treatment options with the patient.  Evaluation and diagnostic testing in the emergency department does not suggest an emergent condition requiring admission or immediate intervention beyond what has been performed at this time.  They will follow up with PCP. We also discussed returning to the ED immediately if new or worsening sx occur. We discussed the sx  which are most concerning (e.g., sudden worsening pain, fever, inability to tolerate by mouth) that necessitate immediate return. Medications administered to the patient during their visit and any new prescriptions provided to the patient are listed below.  Medications given during this visit Medications  dexamethasone  (DECADRON ) tablet 10 mg (10 mg Oral Given 08/27/24 2228)  The patient appears reasonably screen and/or stabilized for discharge and I doubt any other medical condition or other [facility] requiring further screening, evaluation, or treatment in the ED at this time prior to discharge.       Final diagnoses:  Rash    ED Discharge Orders     None          Emil Share, DO 08/27/24 2229

## 2024-08-27 NOTE — Discharge Instructions (Signed)
 Take 4 over the counter ibuprofen  tablets 3 times a day or 2 over-the-counter naproxen  tablets twice a day for pain. Also take tylenol  1000mg (2 extra strength) four times a day.    Please return for rapid spreading of the rash if you develop difficulty breathing or swallowing.  Please follow-up with your family doctor in the office.

## 2024-08-27 NOTE — ED Triage Notes (Signed)
 Pt reports having endoscopy procedure done yesterday. Pt reports pain on both sides of chin starting last night and rash on chest beginning today. Denies any difficulty breathing. NAD noted in triage. No swelling noted inside mouth.

## 2024-10-28 ENCOUNTER — Emergency Department (HOSPITAL_BASED_OUTPATIENT_CLINIC_OR_DEPARTMENT_OTHER)

## 2024-10-28 ENCOUNTER — Encounter (HOSPITAL_BASED_OUTPATIENT_CLINIC_OR_DEPARTMENT_OTHER): Payer: Self-pay | Admitting: Emergency Medicine

## 2024-10-28 ENCOUNTER — Emergency Department (HOSPITAL_BASED_OUTPATIENT_CLINIC_OR_DEPARTMENT_OTHER)
Admission: EM | Admit: 2024-10-28 | Discharge: 2024-10-28 | Disposition: A | Discharge status: Home / Self Care | Attending: Emergency Medicine | Admitting: Emergency Medicine

## 2024-10-28 ENCOUNTER — Other Ambulatory Visit: Payer: Self-pay

## 2024-10-28 DIAGNOSIS — R1084 Generalized abdominal pain: Secondary | ICD-10-CM | POA: Insufficient documentation

## 2024-10-28 DIAGNOSIS — J069 Acute upper respiratory infection, unspecified: Secondary | ICD-10-CM | POA: Insufficient documentation

## 2024-10-28 DIAGNOSIS — I10 Essential (primary) hypertension: Secondary | ICD-10-CM | POA: Insufficient documentation

## 2024-10-28 DIAGNOSIS — R059 Cough, unspecified: Secondary | ICD-10-CM | POA: Diagnosis present

## 2024-10-28 LAB — LIPASE, BLOOD: Lipase: 29 U/L (ref 11–51)

## 2024-10-28 LAB — COMPREHENSIVE METABOLIC PANEL WITH GFR
ALT: 17 U/L (ref 0–44)
AST: 26 U/L (ref 15–41)
Albumin: 4.4 g/dL (ref 3.5–5.0)
Alkaline Phosphatase: 97 U/L (ref 38–126)
Anion gap: 14 (ref 5–15)
BUN: 12 mg/dL (ref 6–20)
CO2: 22 mmol/L (ref 22–32)
Calcium: 9.5 mg/dL (ref 8.9–10.3)
Chloride: 106 mmol/L (ref 98–111)
Creatinine, Ser: 0.69 mg/dL (ref 0.44–1.00)
GFR, Estimated: >60 mL/min (ref >60)
Glucose, Bld: 140 mg/dL — ABNORMAL HIGH (ref 70–99)
Potassium: 3.6 mmol/L (ref 3.5–5.1)
Sodium: 142 mmol/L (ref 135–145)
Total Bilirubin: 0.3 mg/dL (ref 0.0–1.2)
Total Protein: 7.5 g/dL (ref 6.5–8.1)

## 2024-10-28 LAB — RESP PANEL BY RT-PCR (RSV, FLU A&B, COVID)  RVPGX2
Influenza A by PCR: NEGATIVE
Influenza B by PCR: NEGATIVE
Resp Syncytial Virus by PCR: NEGATIVE
SARS Coronavirus 2 by RT PCR: NEGATIVE

## 2024-10-28 LAB — CBC
HCT: 42.7 % (ref 36.0–46.0)
Hemoglobin: 14.3 g/dL (ref 12.0–15.0)
MCH: 29.5 pg (ref 26.0–34.0)
MCHC: 33.5 g/dL (ref 30.0–36.0)
MCV: 88 fL (ref 80.0–100.0)
Platelets: 333 K/uL (ref 150–400)
RBC: 4.85 MIL/uL (ref 3.87–5.11)
RDW: 13.2 % (ref 11.5–15.5)
WBC: 6.5 K/uL (ref 4.0–10.5)
nRBC: 0 % (ref 0.0–0.2)

## 2024-10-28 LAB — URINALYSIS, ROUTINE W REFLEX MICROSCOPIC
Bilirubin Urine: NEGATIVE
Glucose, UA: NEGATIVE mg/dL
Ketones, ur: NEGATIVE mg/dL
Leukocytes,Ua: NEGATIVE
Nitrite: NEGATIVE
Protein, ur: NEGATIVE mg/dL
Specific Gravity, Urine: >=1.03 (ref 1.005–1.030)
pH: 5.5 (ref 5.0–8.0)

## 2024-10-28 LAB — URINALYSIS, MICROSCOPIC (REFLEX)

## 2024-10-28 MED ORDER — DICYCLOMINE HCL 20 MG PO TABS: 20.0000 mg | ORAL_TABLET | Freq: Three times a day (TID) | ORAL | 0 refills | Status: AC | PRN

## 2024-10-28 MED ORDER — ONDANSETRON 4 MG PO TBDP: 4.0000 mg | ORAL_TABLET | Freq: Three times a day (TID) | ORAL | 0 refills | Status: AC | PRN

## 2024-10-28 MED ORDER — DICYCLOMINE HCL 10 MG PO CAPS
10.0000 mg | ORAL_CAPSULE | Freq: Once | ORAL | Status: AC
  Administered 2024-10-28: 10 mg via ORAL
  Filled 2024-10-28: qty 1

## 2024-10-28 MED ORDER — IOHEXOL 300 MG/ML  SOLN
80.0000 mL | Freq: Once | INTRAMUSCULAR | Status: AC | PRN
  Administered 2024-10-28: 80 mL via INTRAVENOUS

## 2024-10-28 NOTE — Discharge Instructions (Signed)

## 2024-10-28 NOTE — ED Provider Notes (Signed)
 Emergency Department Provider Note   I have reviewed the triage vital signs and the nursing notes.   HISTORY  Chief Complaint Abdominal Pain   HPI Stacey Weeks is a 48 y.o. female presents to the emergency department with cough, weakness, abdominal distention.  Symptoms have been progressively worsening over the past 2 weeks.  She has a history of throat and chest symptoms and followed with [facility] for upper endoscopy.  She is awaiting biopsy results from that study but has been compliant with her medications.  She states those symptoms have since improved but is now having more abdominal distention and discomfort.  She has had some associated cough and feeling overall fatigued.  No vomiting or diarrhea.  Describes some cold sweats but no documented fever.    Past Medical History:  Diagnosis Date   Active smoker    Anemia    Blood transfusion 2000   Blood transfusion without reported diagnosis    Chronic pelvic pain in female    Endometriosis    Fibroid, uterus    Headache(784.0)    Hypertension    Palpitations 02/13/2018    Review of Systems  Constitutional: No fever. Some cold sweats.  Cardiovascular: Denies chest pain. Respiratory: Denies shortness of breath. Positive cough.  Gastrointestinal: Positive abdominal pain.  No nausea, no vomiting.  No diarrhea.  No constipation. Genitourinary: Negative for dysuria. Neurological: Negative for headaches.  ____________________________________________   PHYSICAL EXAM:  VITAL SIGNS: ED Triage Vitals [10/28/24 1502]  Encounter Vitals Group     BP (!) 147/109     Pulse Rate (!) 102     Resp 18     Temp 98.8 F (37.1 C)     Temp Source Oral     SpO2 99 %   Constitutional: Alert and oriented. Well appearing and in no acute distress. Eyes: Conjunctivae are normal. Head: Atraumatic. Nose: No congestion/rhinnorhea. Mouth/Throat: Mucous membranes are moist.   Neck: No stridor.  Cardiovascular: Normal rate,  regular rhythm. Good peripheral circulation. Grossly normal heart sounds.   Respiratory: Normal respiratory effort.  No retractions. Lungs CTAB. Gastrointestinal: Soft and nontender. Mild distention.  Musculoskeletal: No lower extremity tenderness nor edema. No gross deformities of extremities. Neurologic:  Normal speech and language. No gross focal neurologic deficits are appreciated.  Skin:  Skin is warm, dry and intact. No rash noted. ____________________________________________   LABS (all labs ordered are listed, but only abnormal results are displayed)  Labs Reviewed  COMPREHENSIVE METABOLIC PANEL WITH GFR - Abnormal; Notable for the following components:      Result Value   Glucose, Bld 140 (*)    All other components within normal limits  URINALYSIS, ROUTINE W REFLEX MICROSCOPIC - Abnormal; Notable for the following components:   Hgb urine dipstick MODERATE (*)    All other components within normal limits  URINALYSIS, MICROSCOPIC (REFLEX) - Abnormal; Notable for the following components:   Bacteria, UA MANY (*)    All other components within normal limits  RESP PANEL BY RT-PCR (RSV, FLU A&B, COVID)  RVPGX2  LIPASE, BLOOD  CBC   ____________________________________________  RADIOLOGY  CT ABDOMEN PELVIS W CONTRAST Result Date: 10/28/2024 EXAM: CT ABDOMEN AND PELVIS WITH CONTRAST 10/28/2024 05:55:42 PM TECHNIQUE: CT of the abdomen and pelvis was performed with the administration of 80 mL of iohexol  (OMNIPAQUE ) 300 MG/ML solution. Multiplanar reformatted images are provided for review. Automated exposure control, iterative reconstruction, and/or weight-based adjustment of the mA/kV was utilized to reduce the radiation dose  to as low as reasonably achievable. COMPARISON: 10/07/2015. Status post hysterectomy. Limited exam due to respiratory motion artifact. CLINICAL HISTORY: Abdominal pain, acute, nonlocalized. FINDINGS: LOWER CHEST: No acute abnormality. LIVER: The liver is  unremarkable. GALLBLADDER AND BILE DUCTS: Gallbladder is unremarkable. No biliary ductal dilatation. SPLEEN: No acute abnormality. PANCREAS: No acute abnormality. ADRENAL GLANDS: No acute abnormality. KIDNEYS, URETERS AND BLADDER: No stones in the kidneys or ureters. No hydronephrosis. No perinephric or periureteral stranding. Urinary bladder is unremarkable. GI AND BOWEL: Stomach demonstrates no acute abnormality. There is no bowel obstruction. PERITONEUM AND RETROPERITONEUM: No ascites. No free air. VASCULATURE: Aorta is normal in caliber. LYMPH NODES: No lymphadenopathy. REPRODUCTIVE ORGANS: Status post hysterectomy. BONES AND SOFT TISSUES: No acute osseous abnormality. No focal soft tissue abnormality. IMPRESSION: 1. No acute findings in the abdomen or pelvis. Evaluation limited by respiratory motion artifact. Electronically signed by: Lynwood Seip MD 10/28/2024 06:03 PM EST RP Workstation: [id number]    ____________________________________________   PROCEDURES  Procedure(s) performed:   Procedures  None  ____________________________________________   INITIAL IMPRESSION / ASSESSMENT AND PLAN / ED COURSE  Pertinent labs & imaging results that were available during my care of the patient were reviewed by me and considered in my medical decision making (see chart for details).   This patient is Presenting for Evaluation of abdominal pain, which does require a range of treatment options, and is a complaint that involves a high risk of morbidity and mortality.  The Differential Diagnoses includes but is not exclusive to acute cholecystitis, intrathoracic causes for epigastric abdominal pain, gastritis, duodenitis, pancreatitis, small bowel or large bowel obstruction, abdominal aortic aneurysm, hernia, gastritis, etc.   Critical Interventions-    Medications  iohexol  (OMNIPAQUE ) 300 MG/ML solution 80 mL (80 mLs Intravenous Contrast Given 10/28/24 1743)  dicyclomine (BENTYL) capsule 10 mg  (10 mg Oral Given 10/28/24 1819)    Reassessment after intervention: symptoms improved.   Clinical Laboratory Tests Ordered, included UA without infection.  COVID/flu negative.  CMP shows normal LFTs, bilirubin, lipase.  No AKI.  CBC without leukocytosis.  Radiologic Tests Ordered, included CT abdomen/pelvis. I independently interpreted the images and agree with radiology interpretation.   Cardiac Monitor Tracing which shows NSR.    Social Determinants of Health Risk patient is a smoker.   Medical Decision Making: Summary:  Patient presents emergency department with now abdominal discomfort.  She has some URI symptoms but in no distress.  Lungs are clear.  No hypoxemia.  Viral panel negative.  Plan for CT abdomen pelvis.  Patient is following with [facility] medical but unable to see the clinic notes or results from endoscopy.   Reevaluation with update and discussion with patient.  She has a reassuring CT abdomen pelvis.  We discussed continued supportive care at home for her URI symptoms.  She plans to call [facility] in the AM for follow up regarding her endoscopy.    Patient's presentation is most consistent with acute presentation with potential threat to life or bodily function.   Disposition: discharge  ____________________________________________  FINAL CLINICAL IMPRESSION(S) / ED DIAGNOSES  Final diagnoses:  Generalized abdominal pain  Upper respiratory tract infection, unspecified type     NEW OUTPATIENT MEDICATIONS STARTED DURING THIS VISIT:  Discharge Medication List as of 10/28/2024  6:14 PM     START taking these medications   Details  dicyclomine (BENTYL) 20 MG tablet Take 1 tablet (20 mg total) by mouth 3 (three) times daily as needed., Starting Thu 10/28/2024, Normal  Note:  This document was prepared using Dragon voice recognition software and may include unintentional dictation errors.  Fonda Law, MD, [facility] Emergency Medicine    Miquan Tandon, Fonda MATSU, MD 10/29/24 [phone]

## 2024-10-28 NOTE — ED Triage Notes (Signed)
 Pt with cough and generalized illness since Thanksgiving.  Pt also reports recently having some testing with removal of tissue samples from her intestines.  Has noted abdominal pressure and bloating with no n/v/d.  No fever but has had some sweating.  Cold sweats
